# Patient Record
Sex: Female | Born: 1960 | Race: Black or African American | Hispanic: No | Marital: Married | Smoking: Never smoker
Health system: Southern US, Community
[De-identification: ages and names within clinical notes are randomized; demographics above are authoritative.]

## PROBLEM LIST (undated history)

## (undated) DIAGNOSIS — E785 Hyperlipidemia, unspecified: Secondary | ICD-10-CM

## (undated) DIAGNOSIS — B009 Herpesviral infection, unspecified: Secondary | ICD-10-CM

## (undated) DIAGNOSIS — E559 Vitamin D deficiency, unspecified: Secondary | ICD-10-CM

## (undated) DIAGNOSIS — M1990 Unspecified osteoarthritis, unspecified site: Secondary | ICD-10-CM

## (undated) DIAGNOSIS — J302 Other seasonal allergic rhinitis: Secondary | ICD-10-CM

## (undated) DIAGNOSIS — F32A Depression, unspecified: Secondary | ICD-10-CM

## (undated) DIAGNOSIS — Z9989 Dependence on other enabling machines and devices: Secondary | ICD-10-CM

## (undated) DIAGNOSIS — F329 Major depressive disorder, single episode, unspecified: Secondary | ICD-10-CM

## (undated) DIAGNOSIS — I10 Essential (primary) hypertension: Secondary | ICD-10-CM

## (undated) DIAGNOSIS — B379 Candidiasis, unspecified: Secondary | ICD-10-CM

## (undated) DIAGNOSIS — M5030 Other cervical disc degeneration, unspecified cervical region: Secondary | ICD-10-CM

## (undated) DIAGNOSIS — R112 Nausea with vomiting, unspecified: Secondary | ICD-10-CM

## (undated) DIAGNOSIS — R7303 Prediabetes: Secondary | ICD-10-CM

## (undated) DIAGNOSIS — Z9889 Other specified postprocedural states: Secondary | ICD-10-CM

## (undated) DIAGNOSIS — G4733 Obstructive sleep apnea (adult) (pediatric): Secondary | ICD-10-CM

## (undated) HISTORY — PX: CARPAL TUNNEL RELEASE: SHX101

## (undated) HISTORY — PX: TONSILLECTOMY: SUR1361

## (undated) HISTORY — PX: KNEE ARTHROSCOPY WITH ANTERIOR CRUCIATE LIGAMENT (ACL) REPAIR: SHX5644

## (undated) HISTORY — PX: DILATION AND CURETTAGE OF UTERUS: SHX78

## (undated) HISTORY — PX: UPPER GI ENDOSCOPY: SHX6162

## (undated) HISTORY — PX: ABDOMINAL HYSTERECTOMY: SHX81

## (undated) HISTORY — PX: WISDOM TOOTH EXTRACTION: SHX21

## (undated) HISTORY — PX: COLONOSCOPY: SHX174

---

## 2004-01-30 ENCOUNTER — Ambulatory Visit (HOSPITAL_COMMUNITY): Admission: RE | Admit: 2004-01-30 | Discharge: 2004-01-30 | Payer: Self-pay | Admitting: Orthopaedic Surgery

## 2007-10-26 ENCOUNTER — Ambulatory Visit: Payer: Self-pay | Admitting: Cardiology

## 2014-02-05 ENCOUNTER — Emergency Department (HOSPITAL_COMMUNITY)
Admission: EM | Admit: 2014-02-05 | Discharge: 2014-02-06 | Disposition: A | Discharge status: Home / Self Care | Payer: BC Managed Care – PPO | Attending: Emergency Medicine | Admitting: Emergency Medicine

## 2014-02-05 ENCOUNTER — Emergency Department (HOSPITAL_COMMUNITY): Payer: BC Managed Care – PPO

## 2014-02-05 ENCOUNTER — Encounter (HOSPITAL_COMMUNITY): Payer: Self-pay | Admitting: Emergency Medicine

## 2014-02-05 DIAGNOSIS — E876 Hypokalemia: Secondary | ICD-10-CM

## 2014-02-05 DIAGNOSIS — R42 Dizziness and giddiness: Secondary | ICD-10-CM | POA: Insufficient documentation

## 2014-02-05 DIAGNOSIS — I10 Essential (primary) hypertension: Secondary | ICD-10-CM | POA: Insufficient documentation

## 2014-02-05 DIAGNOSIS — R609 Edema, unspecified: Secondary | ICD-10-CM

## 2014-02-05 DIAGNOSIS — R51 Headache: Secondary | ICD-10-CM | POA: Insufficient documentation

## 2014-02-05 LAB — CBC WITH DIFFERENTIAL/PLATELET
BASOS ABS: 0 10*3/uL (ref 0.0–0.1)
Basophils Relative: 0 % (ref 0–1)
EOS ABS: 0.1 10*3/uL (ref 0.0–0.7)
Eosinophils Relative: 1 % (ref 0–5)
HCT: 41.2 % (ref 36.0–46.0)
HEMOGLOBIN: 14 g/dL (ref 12.0–15.0)
LYMPHS ABS: 3.7 10*3/uL (ref 0.7–4.0)
Lymphocytes Relative: 37 % (ref 12–46)
MCH: 30.9 pg (ref 26.0–34.0)
MCHC: 34 g/dL (ref 30.0–36.0)
MCV: 90.9 fL (ref 78.0–100.0)
MONO ABS: 1 10*3/uL (ref 0.1–1.0)
MONOS PCT: 10 % (ref 3–12)
NEUTROS PCT: 52 % (ref 43–77)
Neutro Abs: 5.1 10*3/uL (ref 1.7–7.7)
PLATELETS: 247 10*3/uL (ref 150–400)
RBC: 4.53 MIL/uL (ref 3.87–5.11)
RDW: 12.6 % (ref 11.5–15.5)
WBC: 10 10*3/uL (ref 4.0–10.5)

## 2014-02-05 LAB — COMPREHENSIVE METABOLIC PANEL
ALK PHOS: 61 U/L (ref 39–117)
ALT: 22 U/L (ref 0–35)
AST: 25 U/L (ref 0–37)
Albumin: 3.7 g/dL (ref 3.5–5.2)
BUN: 21 mg/dL (ref 6–23)
CALCIUM: 9.1 mg/dL (ref 8.4–10.5)
CHLORIDE: 97 meq/L (ref 96–112)
CO2: 27 meq/L (ref 19–32)
CREATININE: 0.77 mg/dL (ref 0.50–1.10)
GFR calc Af Amer: >90 mL/min (ref >90)
GFR calc non Af Amer: >90 mL/min (ref >90)
GLUCOSE: 108 mg/dL — AB (ref 70–99)
POTASSIUM: 3.3 meq/L — AB (ref 3.7–5.3)
Sodium: 139 mEq/L (ref 137–147)
TOTAL PROTEIN: 7.5 g/dL (ref 6.0–8.3)
Total Bilirubin: 0.2 mg/dL — ABNORMAL LOW (ref 0.3–1.2)

## 2014-02-05 MED ORDER — POTASSIUM CHLORIDE CRYS ER 20 MEQ PO TBCR: 20.0000 meq | EXTENDED_RELEASE_TABLET | Freq: Every day | ORAL | Status: DC

## 2014-02-05 MED ORDER — POTASSIUM CHLORIDE CRYS ER 20 MEQ PO TBCR
40.0000 meq | EXTENDED_RELEASE_TABLET | Freq: Once | ORAL | Status: AC
  Administered 2014-02-05: 40 meq via ORAL
  Filled 2014-02-05: qty 2

## 2014-02-05 NOTE — Discharge Instructions (Signed)
Edema Edema is an abnormal build-up of fluids in tissues. Because this is partly dependent on gravity (water flows to the lowest place), it is more common in the legs and thighs (lower extremities). It is also common in the looser tissues, like around the eyes. Painless swelling of the feet and ankles is common and increases as a person ages. It may affect both legs and may include the calves or even thighs. When squeezed, the fluid may move out of the affected area and may leave a dent for a few moments. CAUSES   Prolonged standing or sitting in one place for extended periods of time. Movement helps pump tissue fluid into the veins, and absence of movement prevents this, resulting in edema.  Varicose veins. The valves in the veins do not work as well as they should. This causes fluid to leak into the tissues.  Fluid and salt overload.  Injury, burn, or surgery to the leg, ankle, or foot, may damage veins and allow fluid to leak out.  Sunburn damages vessels. Leaky vessels allow fluid to go out into the sunburned tissues.  Allergies (from insect bites or stings, medications or chemicals) cause swelling by allowing vessels to become leaky.  Protein in the blood helps keep fluid in your vessels. Low protein, as in malnutrition, allows fluid to leak out.  Hormonal changes, including pregnancy and menstruation, cause fluid retention. This fluid may leak out of vessels and cause edema.  Medications that cause fluid retention. Examples are sex hormones, blood pressure medications, steroid treatment, or anti-depressants.  Some illnesses cause edema, especially heart failure, kidney disease, or liver disease.  Surgery that cuts veins or lymph nodes, such as surgery done for the heart or for breast cancer, may result in edema. DIAGNOSIS  Your caregiver is usually easily able to determine what is causing your swelling (edema) by simply asking what is wrong (getting a history) and examining you (doing  a physical). Sometimes x-rays, EKG (electrocardiogram or heart tracing), and blood work may be done to evaluate for underlying medical illness. TREATMENT  General treatment includes:  Leg elevation (or elevation of the affected body part).  Restriction of fluid intake.  Prevention of fluid overload.  Compression of the affected body part. Compression with elastic bandages or support stockings squeezes the tissues, preventing fluid from entering and forcing it back into the blood vessels.  Diuretics (also called water pills or fluid pills) pull fluid out of your body in the form of increased urination. These are effective in reducing the swelling, but can have side effects and must be used only under your caregiver's supervision. Diuretics are appropriate only for some types of edema. The specific treatment can be directed at any underlying causes discovered. Heart, liver, or kidney disease should be treated appropriately. HOME CARE INSTRUCTIONS   Elevate the legs (or affected body part) above the level of the heart, while lying down.  Avoid sitting or standing still for prolonged periods of time.  Avoid putting anything directly under the knees when lying down, and do not wear constricting clothing or garters on the upper legs.  Exercising the legs causes the fluid to work back into the veins and lymphatic channels. This may help the swelling go down.  The pressure applied by elastic bandages or support stockings can help reduce ankle swelling.  A low-salt diet may help reduce fluid retention and decrease the ankle swelling.  Take any medications exactly as prescribed. SEEK MEDICAL CARE IF:  Your edema is   not responding to recommended treatments. SEEK IMMEDIATE MEDICAL CARE IF:   You develop shortness of breath or chest pain.  You cannot breathe when you lay down; or if, while lying down, you have to get up and go to the window to get your breath.  You are having increasing  swelling without relief from treatment.  You develop a fever over 102 F (38.9 C).  You develop pain or redness in the areas that are swollen.  Tell your caregiver right away if you have gained 03 lb/1.4 kg in 1 day or 05 lb/2.3 kg in a week. MAKE SURE YOU:   Understand these instructions.  Will watch your condition.  Will get help right away if you are not doing well or get worse. Document Released: 08/12/2005 Document Revised: 02/11/2012 Document Reviewed: 03/30/2008 Exit[employer] Patient Information 2014 [employer].  

## 2014-02-05 NOTE — ED Notes (Signed)
Patient reports blood pressure has been elevated since yesterday. Denies history of hypertension. Also reports lower extremity edema since yesterday. Patient states took 40 mg of lasix twice today.

## 2014-02-05 NOTE — ED Provider Notes (Signed)
CSN: [id number]     Arrival date & time 02/05/14  2105 History  This chart was scribed for Joya Gaskinsonald W Shanya Ferriss, MD by Alexandra Clark, ED Scribe. The patient was seen in room APA06/APA06. Patient's care was started at 9:34 PM.  Chief Complaint  Patient presents with  . Hypertension  . Leg Swelling    Patient is a 53 y.o. female presenting with hypertension. The history is provided by the patient. No language interpreter was used.  Hypertension This is a new problem. The current episode started yesterday. The problem occurs constantly. The problem has not changed since onset.Associated symptoms include headaches. Pertinent negatives include no chest pain, no abdominal pain and no shortness of breath. Nothing relieves the symptoms. Treatments tried: Lasix. The treatment provided no relief.    HPI Comments: Alexandra Clark is a 53 y.o. female who presents to the Emergency Department complaining of constant, bilateral foot and leg swelling and elevated blood pressure for the past 1 day. She says that she has never been diagnosed with hypertension and is not on any antihypertensives, but she has a history of fluid retention problems and takes Lasix. She reports that yesterday evening when she noticed the lower extremity swelling, she took Lasix 40 mg. She also took her blood pressure at that time and noted that it was elevated. She states that today, her symptoms have not improved despite taking 2 more doses of Lasix 40 mg. She states that she has gained 5 pounds in the last few days. She has also had an intermittent headache today, but says that pain is only mild to moderate in severity. She denies fever, vomiting, chest pain, nausea, shortness of breath, abdominal pain, or loss of consciousness.  PMH - peripheral edema Past Surgical History  Procedure Laterality Date  . Abdominal hysterectomy     History reviewed. No pertinent family history. History  Substance Use Topics  . Smoking status: Never  Smoker   . Smokeless tobacco: Not on file  . Alcohol Use: No   OB History   Grav Para Term Preterm Abortions TAB SAB Ect Mult Living                 Review of Systems  Constitutional: Negative for fever.  Respiratory: Negative for shortness of breath.   Cardiovascular: Positive for leg swelling. Negative for chest pain.  Gastrointestinal: Negative for nausea, vomiting and abdominal pain.  Neurological: Positive for dizziness and headaches. Negative for syncope.  All other systems reviewed and are negative.   Allergies  Review of patient's allergies indicates no known allergies.  Home Medications   Prior to Admission medications   Not on File   Triage Vitals: BP 168/85  Pulse 90  Temp(Src) 98.4 F (36.9 C) (Oral)  Resp 22  Ht 5' 5.5" (1.664 m)  Wt 215 lb (97.523 kg)  BMI 35.22 kg/m2  SpO2 97% Physical Exam CONSTITUTIONAL: Well developed/well nourished HEAD: Normocephalic/atraumatic EYES: EOMI/PERRL ENMT: Mucous membranes moist NECK: supple no meningeal signs SPINE:entire spine nontender CV: S1/S2 noted, no murmurs/rubs/gallops noted LUNGS: Lungs are clear to auscultation bilaterally, no apparent distress ABDOMEN: soft, nontender, no rebound or guarding GU:no cva tenderness NEURO: Pt is awake/alert, moves all extremitiesx4 EXTREMITIES: pulses normal, full ROM, symmetric pitting edema of bilateral lower extremities No calf tenderness noted SKIN: warm, color normal PSYCH: no abnormalities of mood noted  ED Course  Procedures  DIAGNOSTIC STUDIES: Oxygen Saturation is 97% on room air, normal by my interpretation.    COORDINATION OF  CARE: 9:38 PM- Patient informed of current plan for treatment and evaluation and agrees with plan at this time. 11:14 PM Pt well appearing, no distress, no signs of CHF.  I doubt bilateral DVT with h/o peripheral edema previously Her BP is improved She requests Rx for potassium at discharge and she wants to continue lasix I advised  need for f/u with PCP  Labs Review Labs Reviewed  COMPREHENSIVE METABOLIC PANEL - Abnormal; Notable for the following:    Potassium 3.3 (*)    Glucose, Bld 108 (*)    Total Bilirubin 0.2 (*)    All other components within normal limits  CBC WITH DIFFERENTIAL    Imaging Review Dg Chest 2 View  02/05/2014   CLINICAL DATA:  Hypertension. Bilateral lower extremity swelling. Foot pain. Pain into the left foot and right arm. Shortness of breath.  EXAM: CHEST  2 VIEW  COMPARISON:  None.  FINDINGS: The heart size is normal. The lungs are clear. The visualized soft tissues and bony thorax are unremarkable.  IMPRESSION: Negative two view chest.   Electronically Signed   By: Gennette Pachris  Mattern M.D.   On: 02/05/2014 22:00     EKG Interpretation   Date/Time:  Saturday February 05 2014 22:04:48 EDT Ventricular Rate:  76 PR Interval:  178 QRS Duration: 95 QT Interval:  384 QTC Calculation: 432 R Axis:   24 Text Interpretation:  Sinus rhythm Low voltage, precordial leads  Borderline T abnormalities, anterior leads No previous ECGs available  Confirmed by Bebe ShaggyWICKLINE  MD, Dorinda HillNALD ([id number]) on 02/05/2014 10:08:05 PM      MDM   Final diagnoses:  Hypokalemia  Peripheral edema    Nursing notes including past medical history and social history reviewed and considered in documentation Labs/vital reviewed and considered xrays reviewed and considered   I personally performed the services described in this documentation, which was scribed in my presence. The recorded information has been reviewed and is accurate.     Joya Gaskinsonald W Marti Mclane, MD 02/05/14 [phone]

## 2014-02-05 NOTE — ED Notes (Signed)
Patient reports intermittent shooting pains through left ankle today as well.

## 2015-04-01 ENCOUNTER — Emergency Department (HOSPITAL_COMMUNITY)
Admission: EM | Admit: 2015-04-01 | Discharge: 2015-04-01 | Disposition: A | Discharge status: Home / Self Care | Payer: BLUE CROSS/BLUE SHIELD | Attending: Emergency Medicine | Admitting: Emergency Medicine

## 2015-04-01 ENCOUNTER — Encounter (HOSPITAL_COMMUNITY): Payer: Self-pay | Admitting: Emergency Medicine

## 2015-04-01 ENCOUNTER — Emergency Department (HOSPITAL_COMMUNITY): Payer: BLUE CROSS/BLUE SHIELD

## 2015-04-01 DIAGNOSIS — M25512 Pain in left shoulder: Secondary | ICD-10-CM | POA: Diagnosis present

## 2015-04-01 DIAGNOSIS — I10 Essential (primary) hypertension: Secondary | ICD-10-CM | POA: Insufficient documentation

## 2015-04-01 DIAGNOSIS — Z79899 Other long term (current) drug therapy: Secondary | ICD-10-CM | POA: Diagnosis not present

## 2015-04-01 DIAGNOSIS — M5412 Radiculopathy, cervical region: Secondary | ICD-10-CM | POA: Diagnosis not present

## 2015-04-01 HISTORY — DX: Essential (primary) hypertension: I10

## 2015-04-01 MED ORDER — DIAZEPAM 2 MG PO TABS
2.0000 mg | ORAL_TABLET | Freq: Once | ORAL | Status: AC
  Administered 2015-04-01: 2 mg via ORAL
  Filled 2015-04-01: qty 1

## 2015-04-01 MED ORDER — DIAZEPAM 2 MG PO TABS: 2.0000 mg | ORAL_TABLET | Freq: Three times a day (TID) | ORAL | Status: DC | PRN

## 2015-04-01 MED ORDER — NAPROXEN 250 MG PO TABS
500.0000 mg | ORAL_TABLET | Freq: Once | ORAL | Status: AC
  Administered 2015-04-01: 500 mg via ORAL
  Filled 2015-04-01: qty 2

## 2015-04-01 NOTE — Discharge Instructions (Signed)
Cervical Radiculopathy °Cervical radiculopathy means a nerve in the neck is pinched or bruised. This can cause pain or loss of feeling (numbness) that runs from your neck to your arm and fingers. °HOME CARE  °· Put ice on the injured or painful area. °¨ Put ice in a plastic bag. °¨ Place a towel between your skin and the bag. °¨ Leave the ice on for 15-20 minutes, 03-04 times a day, or as told by your doctor. °· If ice does not help, you can try using heat. Take a warm shower or bath, or use a hot water bottle as told by your doctor. °· You may try a gentle neck and shoulder massage. °· Use a flat pillow when you sleep. °· Only take medicines as told by your doctor. °· Keep all physical therapy visits as told by your doctor. °· If you are given a soft collar, wear it as told by your doctor. °GET HELP RIGHT AWAY IF:  °· Your pain gets worse and is not controlled with medicine. °· You lose feeling or feel weak in your hand, arm, face, or leg. °· You have a fever or stiff neck. °· You cannot control when you poop or pee (incontinence). °· You have trouble with walking, balance, or speaking. °MAKE SURE YOU:  °· Understand these instructions. °· Will watch your condition. °· Will get help right away if you are not doing well or get worse. °Document Released: 08/01/2011 Document Revised: 11/04/2011 Document Reviewed: 08/01/2011 °[employer]® Patient Information ©2015 [employer]. This information is not intended to replace advice given to you by your health care provider. Make sure you discuss any questions you have with your health care provider. ° °

## 2015-04-01 NOTE — ED Notes (Signed)
Pain to left shoulder.  Rates pain 8/10.  Denies injury.

## 2015-04-04 NOTE — ED Provider Notes (Signed)
CSN: [id number]     Arrival date & time 04/01/15  1635 History   First MD Initiated Contact with Patient 04/01/15 1649     Chief Complaint  Patient presents with  . Shoulder Pain     (Consider location/radiation/quality/duration/timing/severity/associated sxs/prior Treatment) The history is provided by the patient.   Alexandra Clark is a 54 y.o. female with a past medical history of HTN presenting with a several day history of left neck and shoulder pain with intermittent radiation into her left arm.  She describes waking in the morning with numbness in her fingertips which improves after a few minutes of movement  (states has had bilateral hand numbness in the past and was told years ago she may have carpal tunnel syndrome but has had no formal testing for this condition).  She denies neck or shoulder injury and denies chest pain, sob, back pain or weakness in her arms or hands.  Pain is worsened with movement of her neck and with palpation along her left lateral neck and shoulder. She has found no alleviators.     Past Medical History  Diagnosis Date  . Hypertension    Past Surgical History  Procedure Laterality Date  . Abdominal hysterectomy     History reviewed. No pertinent family history. History  Substance Use Topics  . Smoking status: Never Smoker   . Smokeless tobacco: Not on file  . Alcohol Use: No   OB History    No data available     Review of Systems  Constitutional: Negative for fever.  Musculoskeletal: Positive for arthralgias and neck pain. Negative for myalgias and joint swelling.  Neurological: Positive for numbness. Negative for weakness.      Allergies  Review of patient's allergies indicates no known allergies.  Home Medications   Prior to Admission medications   Medication Sig Start Date End Date Taking? Authorizing Provider  atenolol-chlorthalidone (TENORETIC) 50-25 MG per tablet Take 1 tablet by mouth daily. 03/03/15  Yes Historical Provider, MD   diclofenac (VOLTAREN) 75 MG EC tablet Take 75 mg by mouth 2 (two) times daily as needed for mild pain.  02/21/15  Yes Historical Provider, MD  furosemide (LASIX) 40 MG tablet Take 40 mg by mouth 2 (two) times daily as needed for fluid.    Yes Historical Provider, MD  KLOR-CON M10 10 MEQ tablet Take 10 mEq by mouth daily as needed. 03/20/15  Yes Historical Provider, MD  Vitamin D, Ergocalciferol, (DRISDOL) 50000 UNITS CAPS capsule Take 1 capsule by mouth once a week. 01/07/14  Yes Historical Provider, MD  diazepam (VALIUM) 2 MG tablet Take 1 tablet (2 mg total) by mouth every 8 (eight) hours as needed for muscle spasms. 04/01/15   Alexandra Amor, PA-C   BP 132/61 mmHg  Pulse 77  Temp(Src) 98 F (36.7 C) (Oral)  Resp 18  Ht 5\' 5"  (1.651 m)  Wt 213 lb (96.616 kg)  BMI 35.44 kg/m2  SpO2 100% Physical Exam  Constitutional: She appears well-developed and well-nourished.  HENT:  Head: Atraumatic.  Neck: Normal range of motion.  Cardiovascular:  Pulses:      Radial pulses are 2+ on the right side, and 2+ on the left side.  Pulses equal bilaterally  Musculoskeletal: She exhibits tenderness.       Cervical back: She exhibits tenderness. She exhibits no bony tenderness, no swelling, no edema and no deformity.       Back:  Point ttp left trapezius at posteriorlateral neck which triggers radiation  of pain into the shoulder. No midline pain.  Neurological: She is alert. She has normal strength. She displays normal reflexes. No cranial nerve deficit or sensory deficit.  Equal grip strength.  Positive Tinels sign bilateral.  Skin: Skin is warm and dry.  Psychiatric: She has a normal mood and affect.    ED Course  Procedures (including critical care time) Labs Review Labs Reviewed - No data to display  Imaging Review No results found.   EKG Interpretation None      MDM   Final diagnoses:  Cervical radiculopathy    Patients labs and/or radiological studies were reviewed and considered  during the medical decision making and disposition process. Imaging was reviewed, interpreted and I agree with radiologists reading. Results were also discussed with patient.  Pt was placed on valium for muscle spasm, advised to take her diclofenac bid, heat tx.  Pt was referred to Dr. Gerilyn Pilgrim for further evaluation of sx.  Pt does have hx and sx suggsting carpal tunnel, but suspect cervical source of radiculopathy as well.   The patient appears reasonably screened and/or stabilized for discharge and I doubt any other medical condition or other [facility] requiring further screening, evaluation, or treatment in the ED at this time prior to discharge.      Alexandra Amor, PA-C 04/04/15 1459  Benjiman Core, MD 04/05/15 [phone]

## 2015-07-04 ENCOUNTER — Emergency Department (HOSPITAL_COMMUNITY)
Admission: EM | Admit: 2015-07-04 | Discharge: 2015-07-04 | Disposition: A | Discharge status: Home / Self Care | Payer: BLUE CROSS/BLUE SHIELD | Attending: Emergency Medicine | Admitting: Emergency Medicine

## 2015-07-04 ENCOUNTER — Encounter (HOSPITAL_COMMUNITY): Payer: Self-pay | Admitting: Cardiology

## 2015-07-04 ENCOUNTER — Emergency Department (HOSPITAL_COMMUNITY): Payer: BLUE CROSS/BLUE SHIELD

## 2015-07-04 DIAGNOSIS — S4992XA Unspecified injury of left shoulder and upper arm, initial encounter: Secondary | ICD-10-CM | POA: Diagnosis present

## 2015-07-04 DIAGNOSIS — Y9389 Activity, other specified: Secondary | ICD-10-CM | POA: Insufficient documentation

## 2015-07-04 DIAGNOSIS — S46912A Strain of unspecified muscle, fascia and tendon at shoulder and upper arm level, left arm, initial encounter: Secondary | ICD-10-CM | POA: Insufficient documentation

## 2015-07-04 DIAGNOSIS — W1839XA Other fall on same level, initial encounter: Secondary | ICD-10-CM | POA: Insufficient documentation

## 2015-07-04 DIAGNOSIS — Y9289 Other specified places as the place of occurrence of the external cause: Secondary | ICD-10-CM | POA: Insufficient documentation

## 2015-07-04 DIAGNOSIS — Z7982 Long term (current) use of aspirin: Secondary | ICD-10-CM | POA: Insufficient documentation

## 2015-07-04 DIAGNOSIS — Y998 Other external cause status: Secondary | ICD-10-CM | POA: Insufficient documentation

## 2015-07-04 DIAGNOSIS — T148XXA Other injury of unspecified body region, initial encounter: Secondary | ICD-10-CM

## 2015-07-04 DIAGNOSIS — I10 Essential (primary) hypertension: Secondary | ICD-10-CM | POA: Diagnosis not present

## 2015-07-04 DIAGNOSIS — Z79899 Other long term (current) drug therapy: Secondary | ICD-10-CM | POA: Insufficient documentation

## 2015-07-04 MED ORDER — OXYCODONE-ACETAMINOPHEN 5-325 MG PO TABS
1.0000 | ORAL_TABLET | Freq: Once | ORAL | Status: AC
  Administered 2015-07-04: 1 via ORAL
  Filled 2015-07-04: qty 1

## 2015-07-04 MED ORDER — OXYCODONE-ACETAMINOPHEN 5-325 MG PO TABS: ORAL_TABLET | ORAL | Status: DC

## 2015-07-04 MED ORDER — FLURBIPROFEN 100 MG PO TABS: 100.0000 mg | ORAL_TABLET | Freq: Two times a day (BID) | ORAL | Status: DC

## 2015-07-04 NOTE — ED Notes (Signed)
Discharge instructions given to pt and her husband, reviewed prescriptions and need for FU-- pt informed this nurse that she had received call from MD office to inform her cholesterol was elevated - will also be returning for fu with regards to hypertension so will see MD re- shoulder if pain continues. Ambulated off unit with husband- declined offer of work notes

## 2015-07-04 NOTE — ED Notes (Signed)
Left arm pain that started this morning.  Pain worse with movement.  States she did fall on that same side last week.

## 2015-07-05 NOTE — ED Provider Notes (Signed)
CSN: [id number]     Arrival date & time 07/04/15  1003 History   First MD Initiated Contact with Patient 07/04/15 1027     Chief Complaint  Patient presents with  . Arm Injury     (Consider location/radiation/quality/duration/timing/severity/associated sxs/prior Treatment) HPI   Alexandra Clark is a 54 y.o. female who presents to the Emergency Department complaining of worsening of left shoulder pain.  She reports hx of pain and tingling of the left arm approximately three months ago and was thought to have cervical radiculopathy.  She states that she was seen by a neurologist and treated with injections to the muscles.  Pain was improving.  She reports a fall last week onto an outstretched hand, pain improved after few days, but woke this morning with sharp pain to the back of her shoulder and radiating into her arm.  Pain was not relieved by her pain medications.  She denies swelling, redness, excessive warmth, neck pain, headaches, chest pain, diaphoresis.  Pain worse with left arm movement.     Past Medical History  Diagnosis Date  . Hypertension    Past Surgical History  Procedure Laterality Date  . Abdominal hysterectomy     History reviewed. No pertinent family history. Social History  Substance Use Topics  . Smoking status: Never Smoker   . Smokeless tobacco: None  . Alcohol Use: No   OB History    No data available     Review of Systems  Constitutional: Negative for fever and chills.  Musculoskeletal: Positive for myalgias and arthralgias. Negative for joint swelling.  Skin: Negative for color change and wound.  Neurological: Negative for dizziness, weakness, light-headedness, numbness and headaches.  All other systems reviewed and are negative.     Allergies  Review of patient's allergies indicates no known allergies.  Home Medications   Prior to Admission medications   Medication Sig Start Date End Date Taking? Authorizing Provider  aspirin 81 MG tablet  Take 81 mg by mouth daily.   Yes Historical Provider, MD  atenolol-chlorthalidone (TENORETIC) 50-25 MG per tablet Take 1 tablet by mouth daily. 03/03/15  Yes Historical Provider, MD  furosemide (LASIX) 40 MG tablet Take 40 mg by mouth 2 (two) times daily as needed for fluid.    Yes Historical Provider, MD  gabapentin (NEURONTIN) 300 MG capsule Take 1 capsule by mouth 2 (two) times daily. 06/25/15  Yes Historical Provider, MD  KLOR-CON M10 10 MEQ tablet Take 10 mEq by mouth daily as needed. 03/20/15  Yes Historical Provider, MD  metaxalone (SKELAXIN) 800 MG tablet Take 1 tablet by mouth 3 (three) times daily as needed. 04/10/15  Yes Historical Provider, MD  ranitidine (ZANTAC) 75 MG tablet Take 75 mg by mouth at bedtime.   Yes Historical Provider, MD  Vitamin D, Ergocalciferol, (DRISDOL) 50000 UNITS CAPS capsule Take 1 capsule by mouth once a week. 01/07/14  Yes Historical Provider, MD  diazepam (VALIUM) 2 MG tablet Take 1 tablet (2 mg total) by mouth every 8 (eight) hours as needed for muscle spasms. Patient not taking: Reported on 07/04/2015 04/01/15   Burgess Amor, PA-C  diclofenac (VOLTAREN) 75 MG EC tablet Take 75 mg by mouth 2 (two) times daily as needed for mild pain.  02/21/15   Historical Provider, MD  flurbiprofen (ANSAID) 100 MG tablet Take 1 tablet (100 mg total) by mouth 2 (two) times daily. Take with food 07/04/15   Tykisha Areola, PA-C  oxyCODONE-acetaminophen (PERCOCET/ROXICET) 5-325 MG tablet One tablet every  4-6 hrs as needed for pain 07/04/15   Ameriah Lint, PA-C   BP 114/70 mmHg  Pulse 62  Temp(Src) 97.8 F (36.6 C) (Oral)  Resp 16  Ht 5\' 5"  (1.651 m)  Wt 223 lb (101.152 kg)  BMI 37.11 kg/m2  SpO2 97% Physical Exam  Constitutional: She is oriented to person, place, and time. She appears well-developed and well-nourished. No distress.  HENT:  Head: Normocephalic and atraumatic.  Neck: Normal range of motion. Neck supple. No thyromegaly present.  Cardiovascular: Normal rate, regular  rhythm, normal heart sounds and intact distal pulses.   No murmur heard. Pulmonary/Chest: Effort normal and breath sounds normal. No respiratory distress. She exhibits no tenderness.  Musculoskeletal: She exhibits tenderness. She exhibits no edema.  Tenderness to palp of the musculature of left scapular border.  Radial pulse is brisk, distal sensation intact, CR< 2 sec. Grip strength is strong and symmetrical.   No abrasions, edema , erythema, excessive warmth or step-off deformity of the joint.   Lymphadenopathy:    She has no cervical adenopathy.  Neurological: She is alert and oriented to person, place, and time. She has normal strength. No sensory deficit. She exhibits normal muscle tone. Coordination normal.  Skin: Skin is warm and dry.  Nursing note and vitals reviewed.   ED Course  Procedures (including critical care time) Labs Review Labs Reviewed - No data to display  Imaging Review Dg Shoulder Left  07/04/2015  CLINICAL DATA:  54 year old female who fell 7 days ago with left shoulder pain. Decreased range of motion. Initial encounter. EXAM: LEFT SHOULDER - 2+ VIEW COMPARISON:  None. FINDINGS: Bone mineralization is within normal limits for age. No glenohumeral joint dislocation. Proximal left humerus intact. Left clavicle and scapula appear intact. Visualized left ribs appear intact. IMPRESSION: No acute osseous abnormality identified about the left shoulder. Electronically Signed   By: Odessa FlemingH  Hall M.D.   On: 07/04/2015 11:32   I have personally reviewed and evaluated these images and lab results as part of my medical decision-making.   EKG Interpretation None      MDM   Final diagnoses:  Muscle strain   Pt is well appearing.  NV, NS intact.  Shoulder film neg for fx.  Tenderness of muscles of the left scapular border.  No spinal tenderness, she has seen neurology in the past for same.  Low clinical suspicion for cardiac process.  Sx's aggravated secondary to fall.  Appears  stable for d/c and agrees to arrange PMD f/u.     Pauline Ausammy Aceton Kinnear, PA-C 07/05/15 2149  Lavera Guiseana Duo Liu, MD 07/05/15 2157

## 2016-07-17 ENCOUNTER — Other Ambulatory Visit: Payer: Self-pay | Admitting: Internal Medicine

## 2016-07-17 DIAGNOSIS — R928 Other abnormal and inconclusive findings on diagnostic imaging of breast: Secondary | ICD-10-CM

## 2016-07-30 ENCOUNTER — Ambulatory Visit
Admission: RE | Admit: 2016-07-30 | Discharge: 2016-07-30 | Disposition: A | Discharge status: Home / Self Care | Payer: BLUE CROSS/BLUE SHIELD | Source: Ambulatory Visit | Attending: Internal Medicine | Admitting: Internal Medicine

## 2016-07-30 DIAGNOSIS — R928 Other abnormal and inconclusive findings on diagnostic imaging of breast: Secondary | ICD-10-CM

## 2017-04-01 ENCOUNTER — Encounter (HOSPITAL_COMMUNITY): Payer: Self-pay | Admitting: *Deleted

## 2017-04-01 ENCOUNTER — Emergency Department (HOSPITAL_COMMUNITY): Payer: BLUE CROSS/BLUE SHIELD

## 2017-04-01 ENCOUNTER — Observation Stay (HOSPITAL_COMMUNITY)
Admission: EM | Admit: 2017-04-01 | Discharge: 2017-04-03 | Disposition: A | Discharge status: Home / Self Care | Payer: BLUE CROSS/BLUE SHIELD | Attending: Cardiology | Admitting: Cardiology

## 2017-04-01 DIAGNOSIS — G5600 Carpal tunnel syndrome, unspecified upper limb: Secondary | ICD-10-CM | POA: Diagnosis not present

## 2017-04-01 DIAGNOSIS — Z79899 Other long term (current) drug therapy: Secondary | ICD-10-CM | POA: Insufficient documentation

## 2017-04-01 DIAGNOSIS — E559 Vitamin D deficiency, unspecified: Secondary | ICD-10-CM | POA: Diagnosis present

## 2017-04-01 DIAGNOSIS — R7303 Prediabetes: Secondary | ICD-10-CM | POA: Insufficient documentation

## 2017-04-01 DIAGNOSIS — Z791 Long term (current) use of non-steroidal anti-inflammatories (NSAID): Secondary | ICD-10-CM | POA: Diagnosis not present

## 2017-04-01 DIAGNOSIS — Z9989 Dependence on other enabling machines and devices: Secondary | ICD-10-CM | POA: Insufficient documentation

## 2017-04-01 DIAGNOSIS — I10 Essential (primary) hypertension: Secondary | ICD-10-CM | POA: Diagnosis not present

## 2017-04-01 DIAGNOSIS — R946 Abnormal results of thyroid function studies: Secondary | ICD-10-CM | POA: Diagnosis not present

## 2017-04-01 DIAGNOSIS — E7800 Pure hypercholesterolemia, unspecified: Secondary | ICD-10-CM | POA: Diagnosis not present

## 2017-04-01 DIAGNOSIS — G4733 Obstructive sleep apnea (adult) (pediatric): Secondary | ICD-10-CM | POA: Diagnosis not present

## 2017-04-01 DIAGNOSIS — M47892 Other spondylosis, cervical region: Secondary | ICD-10-CM | POA: Insufficient documentation

## 2017-04-01 DIAGNOSIS — R0789 Other chest pain: Secondary | ICD-10-CM | POA: Diagnosis not present

## 2017-04-01 DIAGNOSIS — Z7982 Long term (current) use of aspirin: Secondary | ICD-10-CM | POA: Insufficient documentation

## 2017-04-01 DIAGNOSIS — E876 Hypokalemia: Secondary | ICD-10-CM | POA: Diagnosis not present

## 2017-04-01 DIAGNOSIS — I214 Non-ST elevation (NSTEMI) myocardial infarction: Secondary | ICD-10-CM | POA: Diagnosis present

## 2017-04-01 HISTORY — DX: Vitamin D deficiency, unspecified: E55.9

## 2017-04-01 HISTORY — DX: Obstructive sleep apnea (adult) (pediatric): G47.33

## 2017-04-01 HISTORY — DX: Dependence on other enabling machines and devices: Z99.89

## 2017-04-01 HISTORY — DX: Hyperlipidemia, unspecified: E78.5

## 2017-04-01 HISTORY — DX: Other cervical disc degeneration, unspecified cervical region: M50.30

## 2017-04-01 HISTORY — DX: Prediabetes: R73.03

## 2017-04-01 LAB — COMPREHENSIVE METABOLIC PANEL
ALT: 22 U/L (ref 14–54)
ANION GAP: 10 (ref 5–15)
AST: 27 U/L (ref 15–41)
Albumin: 4.2 g/dL (ref 3.5–5.0)
Alkaline Phosphatase: 56 U/L (ref 38–126)
BUN: 18 mg/dL (ref 6–20)
CHLORIDE: 98 mmol/L — AB (ref 101–111)
CO2: 27 mmol/L (ref 22–32)
CREATININE: 1 mg/dL (ref 0.44–1.00)
Calcium: 9.6 mg/dL (ref 8.9–10.3)
Glucose, Bld: 148 mg/dL — ABNORMAL HIGH (ref 65–99)
Potassium: 3 mmol/L — ABNORMAL LOW (ref 3.5–5.1)
Sodium: 135 mmol/L (ref 135–145)
Total Bilirubin: 0.7 mg/dL (ref 0.3–1.2)
Total Protein: 8.5 g/dL — ABNORMAL HIGH (ref 6.5–8.1)

## 2017-04-01 LAB — CBC WITH DIFFERENTIAL/PLATELET
Basophils Absolute: 0 10*3/uL (ref 0.0–0.1)
Basophils Relative: 0 %
EOS PCT: 0 %
Eosinophils Absolute: 0 10*3/uL (ref 0.0–0.7)
HCT: 42.9 % (ref 36.0–46.0)
Hemoglobin: 14.3 g/dL (ref 12.0–15.0)
LYMPHS ABS: 2.5 10*3/uL (ref 0.7–4.0)
LYMPHS PCT: 24 %
MCH: 30.6 pg (ref 26.0–34.0)
MCHC: 33.3 g/dL (ref 30.0–36.0)
MCV: 91.9 fL (ref 78.0–100.0)
MONO ABS: 0.3 10*3/uL (ref 0.1–1.0)
MONOS PCT: 3 %
Neutro Abs: 7.2 10*3/uL (ref 1.7–7.7)
Neutrophils Relative %: 73 %
PLATELETS: 247 10*3/uL (ref 150–400)
RBC: 4.67 MIL/uL (ref 3.87–5.11)
RDW: 13.2 % (ref 11.5–15.5)
WBC: 10.1 10*3/uL (ref 4.0–10.5)

## 2017-04-01 MED ORDER — NITROGLYCERIN 2 % TD OINT
1.0000 [in_us] | TOPICAL_OINTMENT | Freq: Once | TRANSDERMAL | Status: AC
  Administered 2017-04-01: 1 [in_us] via TOPICAL
  Filled 2017-04-01: qty 1

## 2017-04-01 MED ORDER — HEPARIN BOLUS VIA INFUSION
4000.0000 [IU] | Freq: Once | INTRAVENOUS | Status: AC
  Administered 2017-04-01: 4000 [IU] via INTRAVENOUS

## 2017-04-01 MED ORDER — ASPIRIN 81 MG PO CHEW
324.0000 mg | CHEWABLE_TABLET | Freq: Once | ORAL | Status: AC
  Administered 2017-04-01: 324 mg via ORAL
  Filled 2017-04-01: qty 4

## 2017-04-01 MED ORDER — HEPARIN (PORCINE) IN NACL 100-0.45 UNIT/ML-% IJ SOLN
12.0000 [IU]/kg/h | INTRAMUSCULAR | Status: DC
  Administered 2017-04-01: 12 [IU]/kg/h via INTRAVENOUS
  Filled 2017-04-01: qty 250

## 2017-04-01 MED ORDER — METOPROLOL TARTRATE 5 MG/5ML IV SOLN
2.5000 mg | Freq: Once | INTRAVENOUS | Status: AC
  Administered 2017-04-01: 2.5 mg via INTRAVENOUS
  Filled 2017-04-01: qty 5

## 2017-04-01 MED ORDER — ATORVASTATIN CALCIUM 80 MG PO TABS
80.0000 mg | ORAL_TABLET | Freq: Every day | ORAL | Status: DC
  Filled 2017-04-01: qty 1

## 2017-04-01 NOTE — ED Triage Notes (Signed)
C/o chest pressure with shortness of breath

## 2017-04-01 NOTE — ED Notes (Signed)
Date and time results received: 04/01/17 2215   Test: troponin Critical Value: 10.83  Name of Provider Notified: Hyacinth MeekerMiller  Orders Received? Or Actions Taken?: N/A

## 2017-04-01 NOTE — ED Provider Notes (Signed)
[facility]-EMER[facility]ENCY DEPT Provider Note   CSN: [id number] Arrival date & time: 04/01/17  2046     History   Chief Complaint Chief Complaint  Patient presents with  . Chest Pain    HPI AVALON COPPINGER is a 56 y.o. female.  HPI  The patient is a 56 year old female, she has been told in the past that she had hypertension and a thickened heart muscle based on an ultrasound by her doctor. She presents to the hospital today with a complaint of chest pain and shortness of breath which started several hours prior to arrival. It started with shortness of breath, nothing seemed to make this better or worse but it would come and go, shortness of breath got worse at home prompting her visit to the emergency department. In the car on the way here she started to develop chest discomfort especially on the left side of her chest which radiated towards the sternum, was heavy and has now spontaneously resolved. She reports that she is not very active but when she does exert herself she does become dyspneic quite easily which she states is not out of the usual for the last year. She has chronic lymphedema and swelling of the lower extremities for which she wears compression socks and takes Lasix. The patient denies any diaphoresis, coughing, fever, abdominal pain, back pain, headache, blurred vision. She reports having a stress test many years ago. She does not smoke cigarettes and has no other risk factors for pulmonary embolism.  Past Medical History:  Diagnosis Date  . DDD (degenerative disc disease), cervical   . Hypertension     There are no active problems to display for this patient.   Past Surgical History:  Procedure Laterality Date  . ABDOMINAL HYSTERECTOMY      OB History    No data available       Home Medications    Prior to Admission medications   Medication Sig Start Date End Date Taking? Authorizing Provider  aspirin 81 MG tablet Take 81 mg by mouth daily.    [provider]  atenolol-chlorthalidone (TENORETIC) 50-25 MG per tablet Take 1 tablet by mouth daily. 03/03/15   [provider]  diazepam (VALIUM) 2 MG tablet Take 1 tablet (2 mg total) by mouth every 8 (eight) hours as needed for muscle spasms. Patient not taking: Reported on 07/04/2015 04/01/15   Burgess Amor, PA-C  diclofenac (VOLTAREN) 75 MG EC tablet Take 75 mg by mouth 2 (two) times daily as needed for mild pain.  02/21/15   [provider]  flurbiprofen (ANSAID) 100 MG tablet Take 1 tablet (100 mg total) by mouth 2 (two) times daily. Take with food 07/04/15   Triplett, Tammy, PA-C  furosemide (LASIX) 40 MG tablet Take 40 mg by mouth 2 (two) times daily as needed for fluid.     [provider]  gabapentin (NEURONTIN) 300 MG capsule Take 1 capsule by mouth 2 (two) times daily. 06/25/15   [provider]  KLOR-CON M10 10 MEQ tablet Take 10 mEq by mouth daily as needed. 03/20/15   [provider]  metaxalone (SKELAXIN) 800 MG tablet Take 1 tablet by mouth 3 (three) times daily as needed. 04/10/15   [provider]  oxyCODONE-acetaminophen (PERCOCET/ROXICET) 5-325 MG tablet One tablet every 4-6 hrs as needed for pain 07/04/15   Triplett, Tammy, PA-C  ranitidine (ZANTAC) 75 MG tablet Take 75 mg by mouth at bedtime.    [provider]  Vitamin  D, Ergocalciferol, (DRISDOL) 50000 UNITS CAPS capsule Take 1 capsule by mouth once a week. 01/07/14   [provider]    Family History No family history on file.  Social History Social History  Substance Use Topics  . Smoking status: Never Smoker  . Smokeless tobacco: Never Used  . Alcohol use No     Allergies   Patient has no known allergies.   Review of Systems Review of Systems  All other systems reviewed and are negative.    Physical Exam Updated Vital Signs BP (!) 143/74   Pulse (!) 109   Resp (!) 22   Ht 5' 5.5" (1.664 m)   Wt 101.2 kg (223 lb)   SpO2 98%   BMI 36.54 kg/m    Physical Exam  Constitutional: She appears well-developed and well-nourished. No distress.  HENT:  Head: Normocephalic and atraumatic.  Mouth/Throat: Oropharynx is clear and moist. No oropharyngeal exudate.  Eyes: Pupils are equal, round, and reactive to light. Conjunctivae and EOM are normal. Right eye exhibits no discharge. Left eye exhibits no discharge. No scleral icterus.  Neck: Normal range of motion. Neck supple. No JVD present. No thyromegaly present.  Cardiovascular: Normal rate, regular rhythm, normal heart sounds and intact distal pulses.  Exam reveals no gallop and no friction rub.   No murmur heard. Pulmonary/Chest: Effort normal and breath sounds normal. No respiratory distress. She has no wheezes. She has no rales.  Abdominal: Soft. Bowel sounds are normal. She exhibits no distension and no mass. There is no tenderness.  Musculoskeletal: Normal range of motion. She exhibits no edema or tenderness.  Lymphadenopathy:    She has no cervical adenopathy.  Neurological: She is alert. Coordination normal.  Skin: Skin is warm and dry. No rash noted. No erythema.  Psychiatric: She has a normal mood and affect. Her behavior is normal.  Nursing note and vitals reviewed.    ED Treatments / Results  Labs (all labs ordered are listed, but only abnormal results are displayed) Labs Reviewed  CBC WITH DIFFERENTIAL/PLATELET  COMPREHENSIVE METABOLIC PANEL  TROPONIN I    ED ECG REPORT  I personally interpreted this EKG   Date: 04/01/2017   Rate: 112  Rhythm: sinus tachycardia  QRS Axis: normal  Intervals: normal  ST/T Wave abnormalities: ST depression in the inferior leads - abnormal T waves diffusely - mild ST elevation in avR  Conduction Disutrbances:none  Narrative Interpretation:   Old EKG Reviewed: changes noted - prior ECG from 02/05/2014 had no ST or T wave abnormalities  Radiology No results found.  Procedures Procedures (including critical care time)  Medications  Ordered in ED Medications  aspirin chewable tablet 324 mg (not administered)     Initial Impression / Assessment and Plan / ED Course  I have reviewed the triage vital signs and the nursing notes.  Pertinent labs & imaging results that were available during my care of the patient were reviewed by me and considered in my medical decision making (see chart for details).     On exam the patient does not appear to be in distress however her husband states that she is significantly stressed out because of home in life and work-related issues. That being said stress does not account for the abnormal EKG findings thus a acute coronary syndrome workup will be initiated given ST depressions and elevation in aVR. The patient is symptom-free at this time, she will be given aspirin, I will repeat EKG shortly and I anticipate admission to  the hospital.  The patient is now symptom-free, unfortunately her troponin is over 10, other electrolytes seem fine except for a slightly low potassium. Kidney function is normal, x-rays unremarkable, the patient is critically ill with a non-ST elevation MI.  Heparin ordered, heparin drip ordered, cardiology consultation emergently.  ED ECG REPORT  I personally interpreted this EKG   Date: 04/01/2017 Rate: 95   23:29   Rhythm: normal sinus rhythm  QRS Axis: normal  Intervals: normal  ST/T Wave abnormalities: nonspecific T wave changes  Conduction Disutrbances:none  Narrative Interpretation:   Old EKG Reviewed: changes noted - ST segments have normalized  I discussed the care with [facility]anda Magicraci [facility] who [facility] the patient pending bed availability however there is no ICU beds at this time. - pt is feeling well still - atorvastatin / metoprolol given at Dr. Norris Crossurner's guidance.   I [facility] Dr. Lyndel SafeUpadhya who has accepted the pt in transfer - they have no bed available at time of transfer to ED  at [facility]  At change of shift - I have been informed by nursing / secretary that transport is being arranged to transport this patient to the ED at [facility] in the best interest of this patient to be cared for at a larger center with cath lab capability as this hospital ([facility])  has no such services - I have called the charge nurse Irving BurtonEmily as well as the physician in the ED - Dr. Judd Lienelo to let them know about transfer.  Repeat ECG is improved   CRITICAL CARE Performed by: Vida RollerBrian D Edris Schneck Total critical care time: 35 minutes Critical care time was exclusive of separately billable procedures and treating other patients. Critical care was necessary to treat or prevent imminent or life-threatening deterioration. Critical care was time spent personally by me on the following activities: development of treatment plan with patient and/or surrogate as well as nursing, discussions with consultants, evaluation of patient's response to treatment, examination of patient, obtaining history from patient or surrogate, ordering and performing treatments and interventions, ordering and review of laboratory studies, ordering and review of radiographic studies, pulse oximetry and re-evaluation of patient's condition.   Final Clinical Impressions(s) / ED Diagnoses   Final diagnoses:  NSTEMI (non-ST elevated myocardial infarction) [facility](HCC)    New Prescriptions New Prescriptions   No medications on file     Eber HongMiller, Xiadani Damman, MD 04/02/17 0117

## 2017-04-02 ENCOUNTER — Encounter (HOSPITAL_COMMUNITY)
Admission: EM | Disposition: A | Discharge status: Home / Self Care | Payer: Self-pay | Source: Home / Self Care | Attending: Cardiology

## 2017-04-02 ENCOUNTER — Encounter (HOSPITAL_COMMUNITY): Payer: Self-pay | Admitting: Cardiology

## 2017-04-02 DIAGNOSIS — G5600 Carpal tunnel syndrome, unspecified upper limb: Secondary | ICD-10-CM | POA: Diagnosis not present

## 2017-04-02 DIAGNOSIS — E559 Vitamin D deficiency, unspecified: Secondary | ICD-10-CM | POA: Diagnosis not present

## 2017-04-02 DIAGNOSIS — I10 Essential (primary) hypertension: Secondary | ICD-10-CM

## 2017-04-02 DIAGNOSIS — R0789 Other chest pain: Secondary | ICD-10-CM

## 2017-04-02 DIAGNOSIS — M47892 Other spondylosis, cervical region: Secondary | ICD-10-CM | POA: Diagnosis not present

## 2017-04-02 DIAGNOSIS — Z9989 Dependence on other enabling machines and devices: Secondary | ICD-10-CM | POA: Diagnosis not present

## 2017-04-02 DIAGNOSIS — Z79899 Other long term (current) drug therapy: Secondary | ICD-10-CM | POA: Diagnosis not present

## 2017-04-02 DIAGNOSIS — R946 Abnormal results of thyroid function studies: Secondary | ICD-10-CM | POA: Diagnosis not present

## 2017-04-02 DIAGNOSIS — I214 Non-ST elevation (NSTEMI) myocardial infarction: Secondary | ICD-10-CM | POA: Diagnosis not present

## 2017-04-02 DIAGNOSIS — Z7982 Long term (current) use of aspirin: Secondary | ICD-10-CM | POA: Diagnosis not present

## 2017-04-02 DIAGNOSIS — Z791 Long term (current) use of non-steroidal anti-inflammatories (NSAID): Secondary | ICD-10-CM | POA: Diagnosis not present

## 2017-04-02 DIAGNOSIS — E7800 Pure hypercholesterolemia, unspecified: Secondary | ICD-10-CM | POA: Diagnosis not present

## 2017-04-02 DIAGNOSIS — G4733 Obstructive sleep apnea (adult) (pediatric): Secondary | ICD-10-CM | POA: Diagnosis not present

## 2017-04-02 DIAGNOSIS — R7303 Prediabetes: Secondary | ICD-10-CM | POA: Diagnosis not present

## 2017-04-02 DIAGNOSIS — E876 Hypokalemia: Secondary | ICD-10-CM | POA: Diagnosis not present

## 2017-04-02 HISTORY — PX: LEFT HEART CATH AND CORONARY ANGIOGRAPHY: CATH118249

## 2017-04-02 LAB — HIV ANTIBODY (ROUTINE TESTING W REFLEX): HIV SCREEN 4TH GENERATION: NONREACTIVE

## 2017-04-02 LAB — COMPREHENSIVE METABOLIC PANEL
ALT: 19 U/L (ref 14–54)
ANION GAP: 8 (ref 5–15)
AST: 20 U/L (ref 15–41)
Albumin: 3.6 g/dL (ref 3.5–5.0)
Alkaline Phosphatase: 45 U/L (ref 38–126)
BUN: 13 mg/dL (ref 6–20)
CHLORIDE: 103 mmol/L (ref 101–111)
CO2: 27 mmol/L (ref 22–32)
Calcium: 9.3 mg/dL (ref 8.9–10.3)
Creatinine, Ser: 0.71 mg/dL (ref 0.44–1.00)
GFR calc Af Amer: >60 mL/min (ref >60)
Glucose, Bld: 128 mg/dL — ABNORMAL HIGH (ref 65–99)
POTASSIUM: 3.3 mmol/L — AB (ref 3.5–5.1)
SODIUM: 138 mmol/L (ref 135–145)
Total Bilirubin: 0.9 mg/dL (ref 0.3–1.2)
Total Protein: 7.1 g/dL (ref 6.5–8.1)

## 2017-04-02 LAB — LIPID PANEL
Cholesterol: 233 mg/dL — ABNORMAL HIGH (ref 0–200)
HDL: 54 mg/dL (ref >40)
LDL CALC: 173 mg/dL — AB (ref 0–99)
TRIGLYCERIDES: 32 mg/dL (ref <150)
Total CHOL/HDL Ratio: 4.3 RATIO
VLDL: 6 mg/dL (ref 0–40)

## 2017-04-02 LAB — PROTIME-INR
INR: 1.1
Prothrombin Time: 14.3 s (ref 11.4–15.2)

## 2017-04-02 LAB — CBC
HCT: 40.7 % (ref 36.0–46.0)
Hemoglobin: 13.3 g/dL (ref 12.0–15.0)
MCH: 29.3 pg (ref 26.0–34.0)
MCHC: 32.7 g/dL (ref 30.0–36.0)
MCV: 89.6 fL (ref 78.0–100.0)
Platelets: 264 10*3/uL (ref 150–400)
RBC: 4.54 MIL/uL (ref 3.87–5.11)
RDW: 13 % (ref 11.5–15.5)
WBC: 10.3 10*3/uL (ref 4.0–10.5)

## 2017-04-02 LAB — GLUCOSE, CAPILLARY
GLUCOSE-CAPILLARY: 124 mg/dL — AB (ref 65–99)
Glucose-Capillary: 104 mg/dL — ABNORMAL HIGH (ref 65–99)
Glucose-Capillary: 94 mg/dL (ref 65–99)

## 2017-04-02 LAB — HEPARIN LEVEL (UNFRACTIONATED): Heparin Unfractionated: 0.48 IU/mL (ref 0.30–0.70)

## 2017-04-02 LAB — MAGNESIUM: MAGNESIUM: 2.1 mg/dL (ref 1.7–2.4)

## 2017-04-02 LAB — TROPONIN I
Troponin I: <0.03 ng/mL
Troponin I: 0.08 ng/mL

## 2017-04-02 LAB — TSH: TSH: 0.333 u[IU]/mL — AB (ref 0.350–4.500)

## 2017-04-02 LAB — MRSA PCR SCREENING: MRSA BY PCR: NEGATIVE

## 2017-04-02 LAB — HEMOGLOBIN A1C
Hgb A1c MFr Bld: 6.1 % — ABNORMAL HIGH (ref 4.8–5.6)
Mean Plasma Glucose: 128.37 mg/dL

## 2017-04-02 SURGERY — LEFT HEART CATH AND CORONARY ANGIOGRAPHY
Anesthesia: LOCAL

## 2017-04-02 MED ORDER — METOPROLOL TARTRATE 12.5 MG HALF TABLET: 12.5000 mg | ORAL_TABLET | Freq: Two times a day (BID) | ORAL | Status: DC

## 2017-04-02 MED ORDER — SODIUM CHLORIDE 0.9 % WEIGHT BASED INFUSION: 1.0000 mL/kg/h | INTRAVENOUS | Status: DC

## 2017-04-02 MED ORDER — ATORVASTATIN CALCIUM 80 MG PO TABS: 80.0000 mg | ORAL_TABLET | Freq: Every day | ORAL | Status: DC

## 2017-04-02 MED ORDER — NITROGLYCERIN 1 MG/10 ML FOR IR/CATH LAB: INTRA_ARTERIAL | Status: DC | PRN

## 2017-04-02 MED ORDER — POTASSIUM CHLORIDE CRYS ER 20 MEQ PO TBCR
40.0000 meq | EXTENDED_RELEASE_TABLET | Freq: Once | ORAL | Status: AC
  Administered 2017-04-02: 40 meq via ORAL
  Filled 2017-04-02: qty 2

## 2017-04-02 MED ORDER — SODIUM CHLORIDE 0.9 % IV SOLN: INTRAVENOUS | Status: AC

## 2017-04-02 MED ORDER — SODIUM CHLORIDE 0.9 % IV SOLN: 250.0000 mL | INTRAVENOUS | Status: DC | PRN

## 2017-04-02 MED ORDER — ASPIRIN EC 81 MG PO TBEC
81.0000 mg | DELAYED_RELEASE_TABLET | Freq: Every day | ORAL | Status: DC
  Administered 2017-04-03: 11:00:00 81 mg via ORAL
  Filled 2017-04-02: qty 1

## 2017-04-02 MED ORDER — IOPAMIDOL (ISOVUE-370) INJECTION 76%
INTRAVENOUS | Status: DC | PRN
  Administered 2017-04-02: 50 mL via INTRAVENOUS

## 2017-04-02 MED ORDER — METOPROLOL TARTRATE 25 MG PO TABS
25.0000 mg | ORAL_TABLET | Freq: Two times a day (BID) | ORAL | Status: DC
  Administered 2017-04-02 (×2): 25 mg via ORAL
  Filled 2017-04-02 (×2): qty 1

## 2017-04-02 MED ORDER — ASPIRIN 81 MG PO CHEW
81.0000 mg | CHEWABLE_TABLET | ORAL | Status: AC
  Administered 2017-04-02: 81 mg via ORAL
  Filled 2017-04-02: qty 1

## 2017-04-02 MED ORDER — LIDOCAINE HCL 1 % IJ SOLN
INTRAMUSCULAR | Status: AC
  Filled 2017-04-02: qty 20

## 2017-04-02 MED ORDER — SODIUM CHLORIDE 0.9% FLUSH: 3.0000 mL | INTRAVENOUS | Status: DC | PRN

## 2017-04-02 MED ORDER — IOPAMIDOL (ISOVUE-370) INJECTION 76%
INTRAVENOUS | Status: AC
  Filled 2017-04-02: qty 100

## 2017-04-02 MED ORDER — SODIUM CHLORIDE 0.9 % WEIGHT BASED INFUSION
3.0000 mL/kg/h | INTRAVENOUS | Status: DC
  Administered 2017-04-02: 3 mL/kg/h via INTRAVENOUS

## 2017-04-02 MED ORDER — VERAPAMIL HCL 2.5 MG/ML IV SOLN
INTRAVENOUS | Status: DC | PRN
  Administered 2017-04-02: 12:00:00 via INTRA_ARTERIAL

## 2017-04-02 MED ORDER — ASPIRIN 81 MG PO CHEW: 324.0000 mg | CHEWABLE_TABLET | ORAL | Status: DC

## 2017-04-02 MED ORDER — HEPARIN SODIUM (PORCINE) 1000 UNIT/ML IJ SOLN
INTRAMUSCULAR | Status: DC | PRN
Start: 2017-04-02 — End: 2017-04-02
  Administered 2017-04-02: 5000 [IU] via INTRAVENOUS

## 2017-04-02 MED ORDER — MIDAZOLAM HCL 2 MG/2ML IJ SOLN
INTRAMUSCULAR | Status: AC
  Filled 2017-04-02: qty 2

## 2017-04-02 MED ORDER — MIDAZOLAM HCL 2 MG/2ML IJ SOLN
INTRAMUSCULAR | Status: DC | PRN
  Administered 2017-04-02: 1 mg via INTRAVENOUS

## 2017-04-02 MED ORDER — ONDANSETRON HCL 4 MG/2ML IJ SOLN: 4.0000 mg | Freq: Four times a day (QID) | INTRAMUSCULAR | Status: DC | PRN

## 2017-04-02 MED ORDER — FENTANYL CITRATE (PF) 100 MCG/2ML IJ SOLN
INTRAMUSCULAR | Status: AC
  Filled 2017-04-02: qty 2

## 2017-04-02 MED ORDER — HEPARIN (PORCINE) IN NACL 100-0.45 UNIT/ML-% IJ SOLN
1200.0000 [IU]/h | INTRAMUSCULAR | Status: DC
Start: 2017-04-02 — End: 2017-04-02

## 2017-04-02 MED ORDER — FENTANYL CITRATE (PF) 100 MCG/2ML IJ SOLN
INTRAMUSCULAR | Status: DC | PRN
  Administered 2017-04-02: 50 ug via INTRAVENOUS

## 2017-04-02 MED ORDER — ACETAMINOPHEN 325 MG PO TABS: 650.0000 mg | ORAL_TABLET | ORAL | Status: DC | PRN

## 2017-04-02 MED ORDER — NITROGLYCERIN IN D5W 200-5 MCG/ML-% IV SOLN: 0.0000 ug/min | INTRAVENOUS | Status: DC

## 2017-04-02 MED ORDER — SODIUM CHLORIDE 0.9% FLUSH
3.0000 mL | Freq: Two times a day (BID) | INTRAVENOUS | Status: DC
  Administered 2017-04-02: 3 mL via INTRAVENOUS

## 2017-04-02 MED ORDER — HEPARIN (PORCINE) IN NACL 2-0.9 UNIT/ML-% IJ SOLN
INTRAMUSCULAR | Status: AC
  Filled 2017-04-02: qty 1000

## 2017-04-02 MED ORDER — SODIUM CHLORIDE 0.9% FLUSH: 3.0000 mL | Freq: Two times a day (BID) | INTRAVENOUS | Status: DC

## 2017-04-02 MED ORDER — LIDOCAINE HCL (PF) 1 % IJ SOLN
INTRAMUSCULAR | Status: DC | PRN
  Administered 2017-04-02: 2 mL

## 2017-04-02 MED ORDER — ASPIRIN 300 MG RE SUPP: 300.0000 mg | RECTAL | Status: DC

## 2017-04-02 MED ORDER — NITROGLYCERIN 0.4 MG SL SUBL
0.4000 mg | SUBLINGUAL_TABLET | SUBLINGUAL | Status: DC | PRN
Start: 2017-04-02 — End: 2017-04-03

## 2017-04-02 MED ORDER — VERAPAMIL HCL 2.5 MG/ML IV SOLN
INTRAVENOUS | Status: AC
  Filled 2017-04-02: qty 2

## 2017-04-02 MED ORDER — HEPARIN SODIUM (PORCINE) 1000 UNIT/ML IJ SOLN
INTRAMUSCULAR | Status: AC
  Filled 2017-04-02: qty 1

## 2017-04-02 MED ORDER — HEPARIN (PORCINE) IN NACL 2-0.9 UNIT/ML-% IJ SOLN
INTRAMUSCULAR | Status: AC | PRN
  Administered 2017-04-02: 1000 mL via INTRA_ARTERIAL

## 2017-04-02 SURGICAL SUPPLY — 9 items
CATH OPTITORQUE JACKY 4.0 5F (CATHETERS) ×2 IMPLANT
DEVICE RAD COMP TR BAND LRG (VASCULAR PRODUCTS) ×2 IMPLANT
GLIDESHEATH SLEND SS 6F .021 (SHEATH) ×2 IMPLANT
GUIDEWIRE INQWIRE 1.5J.035X260 (WIRE) ×1 IMPLANT
INQWIRE 1.5J .035X260CM (WIRE) ×2
KIT HEART LEFT (KITS) ×2 IMPLANT
PACK CARDIAC CATHETERIZATION (CUSTOM PROCEDURE TRAY) ×2 IMPLANT
TRANSDUCER W/STOPCOCK (MISCELLANEOUS) ×2 IMPLANT
TUBING CIL FLEX 10 FLL-RA (TUBING) ×2 IMPLANT

## 2017-04-02 NOTE — H&P (View-Only) (Signed)
Progress Note  Patient Name: Alexandra Clark Date of Encounter: 04/02/2017  Primary Cardiologist: Armanda Magic MD- new  Subjective   Feels exhausted. No chest pain or SOB currently.  Inpatient Medications    Scheduled Meds: . aspirin  81 mg Oral Pre-Cath  . [START ON 04/03/2017] aspirin EC  81 mg Oral Daily  . atorvastatin  80 mg Oral q1800  . metoprolol tartrate  12.5 mg Oral BID  . potassium chloride  40 mEq Oral Once  . sodium chloride flush  3 mL Intravenous Q12H   Continuous Infusions: . sodium chloride    . sodium chloride 1 mL/kg/hr (04/02/17 0700)  . heparin    . nitroGLYCERIN     PRN Meds: sodium chloride, acetaminophen, nitroGLYCERIN, ondansetron (ZOFRAN) IV, sodium chloride flush   Vital Signs    Vitals:   04/02/17 0400 04/02/17 0500 04/02/17 0600 04/02/17 0700  BP: 116/73 112/69 122/66 123/64  Pulse: 64 65 76 69  Resp: 20 (!) 22 (!) 27 (!) 24  Temp:      TempSrc:      SpO2: 99% 95% 95% 95%  Weight:      Height:        Intake/Output Summary (Last 24 hours) at 04/02/17 0746 Last data filed at 04/02/17 0700  Gross per 24 hour  Intake           408.26 ml  Output              300 ml  Net           108.26 ml   Filed Weights   04/01/17 2055 04/02/17 0340  Weight: 223 lb (101.2 kg) 223 lb 1.7 oz (101.2 kg)    Telemetry    NSR - Personally Reviewed  ECG    NSR with nonspecific TWA, mildly prolonged QTc 480 msec - Personally Reviewed  Physical Exam   GEN: WD, obese BF in NAD.  Neck: No JVD Cardiac: RRR, no murmurs, rubs, or gallops.  Respiratory: Clear to auscultation bilaterally. GI: Soft, nontender, non-distended  MS: Legs big but no edema; No deformity. Neuro:  Nonfocal  Psych: Normal affect   Labs    Chemistry Recent Labs Lab 04/01/17 2131 04/02/17 0520  NA 135 138  K 3.0* 3.3*  CL 98* 103  CO2 27 27  GLUCOSE 148* 128*  BUN 18 13  CREATININE 1.00 0.71  CALCIUM 9.6 9.3  PROT 8.5* 7.1  ALBUMIN 4.2 3.6  AST 27 20  ALT 22  19  ALKPHOS 56 45  BILITOT 0.7 0.9  GFRNONAA >60 >60  GFRAA >60 >60  ANIONGAP 10 8     Hematology Recent Labs Lab 04/01/17 2131 04/02/17 0520  WBC 10.1 10.3  RBC 4.67 4.54  HGB 14.3 13.3  HCT 42.9 40.7  MCV 91.9 89.6  MCH 30.6 29.3  MCHC 33.3 32.7  RDW 13.2 13.0  PLT 247 264    Cardiac Enzymes Recent Labs Lab 04/01/17 2131 04/02/17 0520  TROPONINI 10.83* <0.03   No results for input(s): TROPIPOC in the last 168 hours.   BNPNo results for input(s): BNP, PROBNP in the last 168 hours.   DDimer No results for input(s): DDIMER in the last 168 hours.   Radiology    Dg Chest Port 1 View  Result Date: 04/01/2017 CLINICAL DATA:  Dyspnea and left-sided chest pain prior to arrival. EXAM: PORTABLE CHEST 1 VIEW COMPARISON:  02/05/2014 FINDINGS: The heart size and mediastinal contours are within normal limits.  Both lungs are clear. The visualized skeletal structures are unremarkable. IMPRESSION: No active disease. Electronically Signed   By: Tollie Ethavid  Kwon M.D.   On: 04/01/2017 21:54    Cardiac Studies   none  Patient Profile     56 y.o. female with history of HTN, untreated HLD, prediabetes, OSA presents with NSTEMI.   Assessment & Plan    1. NSTEMI. Troponin 10.83 yesterday - now <0.03. Ecg shows nonspecific TWA. She is currently pain free. Continue IV heparin, beta blocker, ASA, statin. Plan invasive evaluation today with cardiac cath and possible PCI. Unusual that troponin is now normal ? Spurious lab result.  2. HTN controlled. tenoretic switched to lopressor due to hypokalemia. 3. Hypokalemia. Will replete 4. Hypercholesterolemia. On high dose statin.  5. Prediabetes. 6. Obesity with OSA on CPAP.  Signed, Peter SwazilandJordan, MD  04/02/2017, 7:46 AM

## 2017-04-02 NOTE — ED Notes (Addendum)
T/c by Dr. Hyacinth MeekerMiller to Dr. Judd Lienelo and charge nurse at [facility], notified of pt arriving ED to ED per policy a no beds available

## 2017-04-02 NOTE — Progress Notes (Signed)
Normal saline @75  cc/hr- requesting food, request honored. 3 cc of air removed fromTR band for a total of 7 cc remaining in band . Offers no c/o.moniotirng

## 2017-04-02 NOTE — H&P (Addendum)
Cardiology Admission History and Physical:   Patient ID: Alexandra Clark; MRN: [id number]; DOB: September 12, 1960   Admission date: 04/01/2017  Primary Care Provider: Ignatius Specking, MD Primary Cardiologist: none (new) Primary Electrophysiologist:  none  Chief Complaint:  Chest pain  Patient Profile:   Alexandra Clark is a 56 y.o. female with a history of DJD of the cervical spine and HTN who presented today to [facility] with complaints of chest pain and SOB.  She was found to have a troponin of 10.83 c/w NSTEMI and is now transferred for further treatment.  Currently she denies any chest pain or SOB.    History of Present Illness:   Alexandra Clark is a 55 y.o. female with a history of DJD of the cervical spine and HTN who presented today to [facility] with complaints of chest pain and SOB.  Her symptoms started several hours prior to admission.  Nothing made her symptoms better and her SOB got progressively worse and she decided to go to the ER.  On the way there she developed CP that was substernal with a heaviness.  But the time she got to the ER the CP had resolved.  She has a history of DOE but is fairly sedentary.  She has chronic lymphedema and wears compression hose and takes Lasix.  She denies any nausea, diaphoresis, PND or orthopnea.  No palpitations, dizziness or syncope.  She was found to have a troponin of 10 c/w NSTEMI and is now transferred for further treatment.  Currently she denies any chest pain or SOB.     Past Medical History:  Diagnosis Date  . Carpal tunnel syndrome   . DDD (degenerative disc disease), cervical   . Hyperlipidemia   . Hypertension   . OSA on CPAP   . Pre-diabetes   . Vitamin D deficiency     Past Surgical History:  Procedure Laterality Date  . ABDOMINAL HYSTERECTOMY    . CARPAL TUNNEL RELEASE    . KNEE ARTHROSCOPY WITH ANTERIOR CRUCIATE LIGAMENT (ACL) REPAIR    . TONSILLECTOMY       Medications Prior to Admission: Prior to Admission medications    Medication Sig Start Date End Date Taking? Authorizing Provider  atenolol-chlorthalidone (TENORETIC) 50-25 MG per tablet Take 1 tablet by mouth daily. 03/03/15  Yes [provider]  Cholecalciferol (VITAMIN D3) 50000 units CAPS Take 1 capsule by mouth once a week. 02/15/17  Yes [provider]  furosemide (LASIX) 40 MG tablet Take 40 mg by mouth daily as needed for fluid. Takes in course of three days as needed for fluid retention   Yes [provider]  KLOR-CON M10 10 MEQ tablet Take 10 mEq by mouth daily as needed. In addition to Lasix (Furosemide) 03/20/15  Yes [provider]  meloxicam (MOBIC) 7.5 MG tablet Take 7.5 mg by mouth daily.  04/01/17   [provider]     Allergies:   No Known Allergies  Social History:   Social History   Social History  . Marital status: Married    Spouse name: N/A  . Number of children: N/A  . Years of education: N/A   Occupational History  . Not on file.   Social History Main Topics  . Smoking status: Never Smoker  . Smokeless tobacco: Never Used  . Alcohol use No  . Drug use: No  . Sexual activity: Not on file   Other Topics Concern  . Not on file  Social History Narrative  . No narrative on file    Family History:   The patient's family history includes CVA in her father; Diabetes in her mother; Heart attack (age of onset: 1980) in her mother; Hyperlipidemia in her father and mother; Hypertension in her father and mother.    ROS:  Please see the history of present illness.  All other ROS reviewed and negative.     Physical Exam/Data:   Vitals:   04/02/17 0006 04/02/17 0030 04/02/17 0100 04/02/17 0130  BP: 130/79 135/76 (!) 115/58 131/77  Pulse: 87 84 81 84  Resp: (!) 26 20 18  (!) 24  Temp:      TempSrc:      SpO2: 97% 99% 95% 97%  Weight:      Height:       No intake or output data in the 24 hours ending 04/02/17 0318 Filed Weights   04/01/17 2055  Weight: 223 lb (101.2 kg)   Body  mass index is 36.54 kg/m.  General:  Well nourished, well developed, in no acute distress HEENT: normal Lymph: no adenopathy Neck: no JVD Endocrine:  No thryomegaly Vascular: No carotid bruits; FA pulses 2+ bilaterally without bruits  Cardiac:  normal S1, S2; RRR; no murmur  Lungs:  clear to auscultation bilaterally, no wheezing, rhonchi or rales  Abd: soft, nontender, no hepatomegaly  Ext: chronic lymphedema Musculoskeletal:  No deformities, BUE and BLE strength normal and equal Skin: warm and dry  Neuro:  CNs 2-12 intact, no focal abnormalities noted Psych:  Normal affect    EKG:  The ECG that was done and was personally reviewed and demonstrates NSR with nonspecific ST abnormality and prolonged QTc  Relevant CV Studies: none  Laboratory Data:  Chemistry  Recent Labs Lab 04/01/17 2131  NA 135  K 3.0*  CL 98*  CO2 27  GLUCOSE 148*  BUN 18  CREATININE 1.00  CALCIUM 9.6  GFRNONAA >60  GFRAA >60  ANIONGAP 10     Recent Labs Lab 04/01/17 2131  PROT 8.5*  ALBUMIN 4.2  AST 27  ALT 22  ALKPHOS 56  BILITOT 0.7   Hematology  Recent Labs Lab 04/01/17 2131  WBC 10.1  RBC 4.67  HGB 14.3  HCT 42.9  MCV 91.9  MCH 30.6  MCHC 33.3  RDW 13.2  PLT 247   Cardiac Enzymes  Recent Labs Lab 04/01/17 2131  TROPONINI 10.83*   No results for input(s): TROPIPOC in the last 168 hours.  BNPNo results for input(s): BNP, PROBNP in the last 168 hours.  DDimer No results for input(s): DDIMER in the last 168 hours.  Radiology/Studies:  Dg Chest Port 1 View  Result Date: 04/01/2017 CLINICAL DATA:  Dyspnea and left-sided chest pain prior to arrival. EXAM: PORTABLE CHEST 1 VIEW COMPARISON:  02/05/2014 FINDINGS: The heart size and mediastinal contours are within normal limits. Both lungs are clear. The visualized skeletal structures are unremarkable. IMPRESSION: No active disease. Electronically Signed   By: Tollie Ethavid  Kwon M.D.   On: 04/01/2017 21:54    Assessment and Plan:    1. NSTEMI - she had SOB which started earlier today and then had chest pain that lasted on the car ride to the ER.  Troponin elevated at 10.83.  Currently pain free.  EKG with nonspecific ST abnormality.   - admit to tele bed - cycle trop - IV Heparin per pharmacy - IV NTG gtt - ASA 81mg  daily - high dose statin with Lipitor 80mg   daily - Hold Atenoretic - Start Lopressor 12.5mg  BID - 2D echo in am to assess LVF - NPO for cath in am - Left heart cath in am - Cardiac catheterization was discussed with the patient fully. The patient understands that risks include but are not limited to stroke (1 in 1000), death (1 in 1000), kidney failure [usually temporary] (1 in 500), bleeding (1 in 200), allergic reaction [possibly serious] (1 in 200).  The patient understands and is willing to proceed.    2.  Hypokalemia - replete  - repeat BMET in am  3.  Prolonged QT in the setting of hypokalemia - repeat EKG in am once potassium repleted  4.  HTN - BP currently controlled - diuretic on hold for cath - start on Lopressor 12.5mg  BID  5.  Hyperlipidemia - start Lipitor 80mg  daily  6.  Pre dm - check HbA1C  7.  OSA - CPAP per respiratory at night   Severity of Illness: The appropriate patient status for this patient is OBSERVATION. Observation status is judged to be reasonable and necessary in order to provide the required intensity of service to ensure the patient's safety. The patient's presenting symptoms, physical exam findings, and initial radiographic and laboratory data in the context of their medical condition is felt to place them at decreased risk for further clinical deterioration. Furthermore, it is anticipated that the patient will be medically stable for discharge from the hospital within 2 midnights of admission. The following factors support the patient status of observation.   " The patient's presenting symptoms include chest pain and SOB. " The physical exam findings  include LE edema. " The initial radiographic and laboratory data are elevated trop at 10.83 and low potassium.     Signed, Armanda Magic, MD  04/02/2017 3:18 AM

## 2017-04-02 NOTE — Plan of Care (Signed)
Problem: Safety: Goal: Ability to remain free from injury will improve Outcome: Progressing Patient and RN reviewed safety and the need to call for help before ambulation. We also discussed the affects of medication on balance and blood pressure. Patient verbalizes understanding.

## 2017-04-02 NOTE — Progress Notes (Signed)
Patient off unit to cath lab.

## 2017-04-02 NOTE — Progress Notes (Signed)
ANTICOAGULATION CONSULT NOTE - Follow-up Consult  Pharmacy Consult for heparin  Indication: chest pain/ACS  No Known Allergies  Patient Measurements: Height: 5' 5.5" (166.4 cm) Weight: 223 lb (101.2 kg) IBW/kg (Calculated) : 58.15 Heparin Dosing Weight: 81 kg   Vital Signs: Temp: 98.7 F (37.1 C) (08/07 2157) Temp Source: Oral (08/07 2157) BP: 131/77 (08/08 0130) Pulse Rate: 84 (08/08 0130)  Labs:  Recent Labs  04/01/17 2131  HGB 14.3  HCT 42.9  PLT 247  CREATININE 1.00  TROPONINI 10.83*    Estimated Creatinine Clearance: 76.6 mL/min (by C-G formula based on SCr of 1 mg/dL).   Medical History: Past Medical History:  Diagnosis Date  . Carpal tunnel syndrome   . DDD (degenerative disc disease), cervical   . Hyperlipidemia   . Hypertension   . OSA on CPAP   . Pre-diabetes   . Vitamin D deficiency     Assessment: 56 yo female transferred from [facility] ED with chest pain. Heparin gtt started at [facility] by Dr. Hyacinth MeekerMiller at rate of 12 units/kg/hr after a one-time 4000 unit bolus.   Patient is not on oral anticoagulation PTA. Pharmacy consulted to continue dosing heparin gtt. Heparin level therapeutic at 0.48. CBC stable and no overt s/s bleeding noted.   Goal of Therapy:  Heparin level 0.3-0.7 units/ml Monitor platelets by anticoagulation protocol: Yes   Plan:  Continue heparin gtt at 1200 units/hr  Confirm heparin level in 6 hrs  Daily heparin level and CBC Monitor for s/s bleeding F/u cards plans   York CeriseKatherine Cook, PharmD Clinical Pharmacist 04/02/17 6:42 AM

## 2017-04-02 NOTE — Progress Notes (Addendum)
Progress Note  Patient Name: BLANCH STANG Date of Encounter: 04/02/2017  Primary Cardiologist: Armanda Magic MD- new  Subjective   Feels exhausted. No chest pain or SOB currently.  Inpatient Medications    Scheduled Meds: . aspirin  81 mg Oral Pre-Cath  . [START ON 04/03/2017] aspirin EC  81 mg Oral Daily  . atorvastatin  80 mg Oral q1800  . metoprolol tartrate  12.5 mg Oral BID  . potassium chloride  40 mEq Oral Once  . sodium chloride flush  3 mL Intravenous Q12H   Continuous Infusions: . sodium chloride    . sodium chloride 1 mL/kg/hr (04/02/17 0700)  . heparin    . nitroGLYCERIN     PRN Meds: sodium chloride, acetaminophen, nitroGLYCERIN, ondansetron (ZOFRAN) IV, sodium chloride flush   Vital Signs    Vitals:   04/02/17 0400 04/02/17 0500 04/02/17 0600 04/02/17 0700  BP: 116/73 112/69 122/66 123/64  Pulse: 64 65 76 69  Resp: 20 (!) 22 (!) 27 (!) 24  Temp:      TempSrc:      SpO2: 99% 95% 95% 95%  Weight:      Height:        Intake/Output Summary (Last 24 hours) at 04/02/17 0746 Last data filed at 04/02/17 0700  Gross per 24 hour  Intake           408.26 ml  Output              300 ml  Net           108.26 ml   Filed Weights   04/01/17 2055 04/02/17 0340  Weight: 223 lb (101.2 kg) 223 lb 1.7 oz (101.2 kg)    Telemetry    NSR - Personally Reviewed  ECG    NSR with nonspecific TWA, mildly prolonged QTc 480 msec - Personally Reviewed  Physical Exam   GEN: WD, obese BF in NAD.  Neck: No JVD Cardiac: RRR, no murmurs, rubs, or gallops.  Respiratory: Clear to auscultation bilaterally. GI: Soft, nontender, non-distended  MS: Legs big but no edema; No deformity. Neuro:  Nonfocal  Psych: Normal affect   Labs    Chemistry Recent Labs Lab 04/01/17 2131 04/02/17 0520  NA 135 138  K 3.0* 3.3*  CL 98* 103  CO2 27 27  GLUCOSE 148* 128*  BUN 18 13  CREATININE 1.00 0.71  CALCIUM 9.6 9.3  PROT 8.5* 7.1  ALBUMIN 4.2 3.6  AST 27 20  ALT 22  19  ALKPHOS 56 45  BILITOT 0.7 0.9  GFRNONAA >60 >60  GFRAA >60 >60  ANIONGAP 10 8     Hematology Recent Labs Lab 04/01/17 2131 04/02/17 0520  WBC 10.1 10.3  RBC 4.67 4.54  HGB 14.3 13.3  HCT 42.9 40.7  MCV 91.9 89.6  MCH 30.6 29.3  MCHC 33.3 32.7  RDW 13.2 13.0  PLT 247 264    Cardiac Enzymes Recent Labs Lab 04/01/17 2131 04/02/17 0520  TROPONINI 10.83* <0.03   No results for input(s): TROPIPOC in the last 168 hours.   BNPNo results for input(s): BNP, PROBNP in the last 168 hours.   DDimer No results for input(s): DDIMER in the last 168 hours.   Radiology    Dg Chest Port 1 View  Result Date: 04/01/2017 CLINICAL DATA:  Dyspnea and left-sided chest pain prior to arrival. EXAM: PORTABLE CHEST 1 VIEW COMPARISON:  02/05/2014 FINDINGS: The heart size and mediastinal contours are within normal limits.  Both lungs are clear. The visualized skeletal structures are unremarkable. IMPRESSION: No active disease. Electronically Signed   By: Tollie Ethavid  Kwon M.D.   On: 04/01/2017 21:54    Cardiac Studies   none  Patient Profile     56 y.o. female with history of HTN, untreated HLD, prediabetes, OSA presents with NSTEMI.   Assessment & Plan    1. NSTEMI. Troponin 10.83 yesterday - now <0.03. Ecg shows nonspecific TWA. She is currently pain free. Continue IV heparin, beta blocker, ASA, statin. Plan invasive evaluation today with cardiac cath and possible PCI. Unusual that troponin is now normal ? Spurious lab result.  2. HTN controlled. tenoretic switched to lopressor due to hypokalemia. 3. Hypokalemia. Will replete 4. Hypercholesterolemia. On high dose statin.  5. Prediabetes. 6. Obesity with OSA on CPAP.  Signed, Annayah Worthley [place], MD  04/02/2017, 7:46 AM

## 2017-04-02 NOTE — Interval H&P Note (Signed)
Cath Lab Visit (complete for each Cath Lab visit)  Clinical Evaluation Leading to the Procedure:   ACS: Yes.    Non-ACS:  n/a       History and Physical Interval Note:  04/02/2017 11:42 AM  Alexandra Clark  has presented today for surgery, with the diagnosis of NSTEMI  The various methods of treatment have been discussed with the patient and family. After consideration of risks, benefits and other options for treatment, the patient has consented to  Procedure(s): LEFT HEART CATH AND CORONARY ANGIOGRAPHY (N/A) as a surgical intervention .  The patient's history has been reviewed, patient examined, no change in status, stable for surgery.  I have reviewed the patient's chart and labs.  Questions were answered to the patient's satisfaction.     Lorine BearsMuhammad Pablo Mathurin

## 2017-04-02 NOTE — Progress Notes (Signed)
Had 5 run of PVC's ,while resting on bed . Denies any pain or discomforts.kept monitored, B. Bhagat notified

## 2017-04-02 NOTE — ED Notes (Signed)
Report to Ryan, [facility] 

## 2017-04-02 NOTE — Care Management Note (Addendum)
Case Management Note  Patient Details  Name: Alexandra Clark MRN: [id number] Date of Birth: 06-11-1961  Subjective/Objective:  From home with spouse,Presents with NSTEMI, S/p left heart cath.                   Action/Plan: NCM will follow for dc needs.   Expected Discharge Date:                  Expected Discharge Plan:     In-House Referral:     Discharge planning Services  CM Consult  Post Acute Care Choice:    Choice offered to:     DME Arranged:    DME Agency:     HH Arranged:    HH Agency:     Status of Service:  In process, will continue to follow  If discussed at Long Length of Stay Meetings, dates discussed:    Additional Comments:  Leone Havenaylor, Staysha Truby Clinton, RN 04/02/2017, 9:29 PM

## 2017-04-03 ENCOUNTER — Observation Stay (HOSPITAL_BASED_OUTPATIENT_CLINIC_OR_DEPARTMENT_OTHER): Payer: BLUE CROSS/BLUE SHIELD

## 2017-04-03 DIAGNOSIS — G4733 Obstructive sleep apnea (adult) (pediatric): Secondary | ICD-10-CM | POA: Diagnosis not present

## 2017-04-03 DIAGNOSIS — I361 Nonrheumatic tricuspid (valve) insufficiency: Secondary | ICD-10-CM

## 2017-04-03 DIAGNOSIS — E559 Vitamin D deficiency, unspecified: Secondary | ICD-10-CM | POA: Diagnosis not present

## 2017-04-03 DIAGNOSIS — Z9989 Dependence on other enabling machines and devices: Secondary | ICD-10-CM

## 2017-04-03 DIAGNOSIS — E7800 Pure hypercholesterolemia, unspecified: Secondary | ICD-10-CM | POA: Diagnosis not present

## 2017-04-03 DIAGNOSIS — R0789 Other chest pain: Secondary | ICD-10-CM | POA: Diagnosis not present

## 2017-04-03 DIAGNOSIS — I214 Non-ST elevation (NSTEMI) myocardial infarction: Secondary | ICD-10-CM | POA: Diagnosis not present

## 2017-04-03 DIAGNOSIS — G5600 Carpal tunnel syndrome, unspecified upper limb: Secondary | ICD-10-CM | POA: Diagnosis not present

## 2017-04-03 DIAGNOSIS — I10 Essential (primary) hypertension: Secondary | ICD-10-CM | POA: Diagnosis not present

## 2017-04-03 LAB — GLUCOSE, CAPILLARY
GLUCOSE-CAPILLARY: 80 mg/dL (ref 65–99)
Glucose-Capillary: 92 mg/dL (ref 65–99)

## 2017-04-03 LAB — CBC
HEMATOCRIT: 38.1 % (ref 36.0–46.0)
Hemoglobin: 12.5 g/dL (ref 12.0–15.0)
MCH: 29.9 pg (ref 26.0–34.0)
MCHC: 32.8 g/dL (ref 30.0–36.0)
MCV: 91.1 fL (ref 78.0–100.0)
Platelets: 263 10*3/uL (ref 150–400)
RBC: 4.18 MIL/uL (ref 3.87–5.11)
RDW: 13.3 % (ref 11.5–15.5)
WBC: 13.5 10*3/uL — ABNORMAL HIGH (ref 4.0–10.5)

## 2017-04-03 LAB — ECHOCARDIOGRAM COMPLETE
HEIGHTINCHES: 65 in
Weight: 3569.69 oz

## 2017-04-03 LAB — TROPONIN I

## 2017-04-03 MED ORDER — HYDROCHLOROTHIAZIDE 25 MG PO TABS
25.0000 mg | ORAL_TABLET | Freq: Every day | ORAL | Status: DC
Start: 2017-04-03 — End: 2017-04-03
  Administered 2017-04-03: 11:00:00 25 mg via ORAL
  Filled 2017-04-03: qty 1

## 2017-04-03 MED ORDER — POTASSIUM CHLORIDE CRYS ER 20 MEQ PO TBCR: 20.0000 meq | EXTENDED_RELEASE_TABLET | Freq: Every day | ORAL | 6 refills | Status: DC

## 2017-04-03 MED ORDER — ATENOLOL 50 MG PO TABS
50.0000 mg | ORAL_TABLET | Freq: Every day | ORAL | Status: DC
  Administered 2017-04-03: 50 mg via ORAL
  Filled 2017-04-03: qty 1

## 2017-04-03 MED ORDER — POTASSIUM CHLORIDE CRYS ER 20 MEQ PO TBCR
20.0000 meq | EXTENDED_RELEASE_TABLET | Freq: Every day | ORAL | Status: DC
  Administered 2017-04-03: 20 meq via ORAL
  Filled 2017-04-03: qty 1

## 2017-04-03 MED ORDER — ANGIOPLASTY BOOK
Freq: Once | Status: DC
  Filled 2017-04-03: qty 1

## 2017-04-03 MED ORDER — ATORVASTATIN CALCIUM 80 MG PO TABS: 80.0000 mg | ORAL_TABLET | Freq: Every day | ORAL | Status: DC

## 2017-04-03 NOTE — Progress Notes (Signed)
  2D Echocardiogram has been performed.  Alexandra Clark T France Lusty 04/03/2017, 9:40 AM

## 2017-04-03 NOTE — Progress Notes (Addendum)
Progress Note  Patient Name: Alexandra Clark Date of Encounter: 04/03/2017  Primary Cardiologist: New to Dr. Mayford Knife  (likley follow up in [place] if needed)  Subjective   Feeling well. No chest pain, sob or palpitations.   Inpatient Medications    Scheduled Meds: . angioplasty book   Does not apply Once  . aspirin EC  81 mg Oral Daily  . metoprolol tartrate  25 mg Oral BID  . sodium chloride flush  3 mL Intravenous Q12H   Continuous Infusions: . sodium chloride     PRN Meds: sodium chloride, acetaminophen, nitroGLYCERIN, ondansetron (ZOFRAN) IV, sodium chloride flush   Vital Signs    Vitals:   04/02/17 2004 04/02/17 2129 04/03/17 0030 04/03/17 0251  BP: (!) 109/47 (!) 111/44 (!) 125/54 (!) 113/56  Pulse: 76 66 62 73  Resp: (!) 26 (!) 23 20 19   Temp: 98.1 F (36.7 C)   97.6 F (36.4 C)  TempSrc: Oral   Oral  SpO2: 97% 97% 98% 98%  Weight:      Height:        Intake/Output Summary (Last 24 hours) at 04/03/17 0801 Last data filed at 04/03/17 0254  Gross per 24 hour  Intake            226.4 ml  Output             1400 ml  Net          -1173.6 ml   Filed Weights   04/01/17 2055 04/02/17 0340  Weight: 223 lb (101.2 kg) 223 lb 1.7 oz (101.2 kg)    Telemetry    NSR at rate of 70s - Personally Reviewed  ECG    SR with non specific TW abnormality - Personally Reviewed  Physical Exam   GEN: No acute distress.   Neck: No JVD Cardiac: RRR, no murmurs, rubs, or gallops. R radial cath site without hematoma.  Respiratory: Clear to auscultation bilaterally. GI: Soft, nontender, non-distended  MS: No edema; No deformity. Neuro:  Nonfocal  Psych: Normal affect   Labs    Chemistry Recent Labs Lab 04/01/17 2131 04/02/17 0520  NA 135 138  K 3.0* 3.3*  CL 98* 103  CO2 27 27  GLUCOSE 148* 128*  BUN 18 13  CREATININE 1.00 0.71  CALCIUM 9.6 9.3  PROT 8.5* 7.1  ALBUMIN 4.2 3.6  AST 27 20  ALT 22 19  ALKPHOS 56 45  BILITOT 0.7 0.9  GFRNONAA >60  >60  GFRAA >60 >60  ANIONGAP 10 8     Hematology Recent Labs Lab 04/01/17 2131 04/02/17 0520 04/03/17 0406  WBC 10.1 10.3 13.5*  RBC 4.67 4.54 4.18  HGB 14.3 13.3 12.5  HCT 42.9 40.7 38.1  MCV 91.9 89.6 91.1  MCH 30.6 29.3 29.9  MCHC 33.3 32.7 32.8  RDW 13.2 13.0 13.3  PLT 247 264 263    Cardiac Enzymes Recent Labs Lab 04/01/17 2131 04/02/17 0520 04/02/17 0946 04/02/17 1450  TROPONINI 10.83* <0.03 <0.03 0.08*   No results for input(s): TROPIPOC in the last 168 hours.   BNPNo results for input(s): BNP, PROBNP in the last 168 hours.   DDimer No results for input(s): DDIMER in the last 168 hours.   Radiology    Dg Chest Port 1 View  Result Date: 04/01/2017 CLINICAL DATA:  Dyspnea and left-sided chest pain prior to arrival. EXAM: PORTABLE CHEST 1 VIEW COMPARISON:  02/05/2014 FINDINGS: The heart size and mediastinal contours are within normal  limits. Both lungs are clear. The visualized skeletal structures are unremarkable. IMPRESSION: No active disease. Electronically Signed   By: Tollie Ethavid  Kwon M.D.   On: 04/01/2017 21:54    Cardiac Studies   LEFT HEART CATH AND CORONARY ANGIOGRAPHY  04/02/17  Conclusion     The left ventricular systolic function is normal.  LV end diastolic pressure is mildly elevated.  The left ventricular ejection fraction is 55-65% by visual estimate.  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   1. Near normal coronary arteries. 2. Normal LV systolic function and wall motion. Mildly elevated left ventricular end-diastolic pressure.  Recommendations: I suspect that the initial elevated troponin was a lab error as subsequent troponins were negative. Chest pain is likely noncardiac.   Pending echo  Patient Profile     Alexandra Clark is a 56 y.o. female with a history of DJD of the cervical spine, OSA and HTN who [facility] with complaints of chest pain and SOB.  She was found to have a troponin of 10.83 c/w NSTEMI and is now  transferred for further treatment.    Assessment & Plan    1. NSTEMI - Troponin of 10.83 at outside hospital (likely error). Here trend < 0.03-->0.08. Remained cath pain free since admission. Cath with near normal coronaries. Mild elevated LVEDP. Pending eho. Ambulate.   2. HTN - Stable on current medication.   She will likely follow up @ [place] due to demographic otherwise PCP given no CAD.   3. HLD LDL 175. Start lipitor 80 mg daily  Signed, CarpenterBhagat,Bhavinkumar, [place]  04/03/2017, 8:01 AM    Patient seen and examined and history reviewed. Agree with above findings and plan. Feels very well. No chest pain or dyspnea. VSS. Echo is pending.  We can resume Tenoretic at discharge but she will need to take a daily potassium supplement 20 meq daily. Plan on DC today after Echo done.  Jameil Whitmoyer [place], MDFACC 04/03/2017 8:56 AM

## 2017-04-03 NOTE — Discharge Summary (Signed)
Discharge Summary    Patient ID: Alexandra Clark,  MRN: [id number], DOB/AGE: 55-Jun-1962 55 y.o.  Admit date: 04/01/2017 Discharge date: 04/03/2017  Primary Care Provider: Ignatius Specking Primary Cardiologist: New to Dr. Mayford Knife  (likley follow up in [place] if needed)  Discharge Diagnoses    Principal Problem:   NSTEMI (non-ST elevated myocardial infarction) [facility]) Active Problems:   Pre-diabetes   OSA on CPAP   Vitamin D deficiency   Carpal tunnel syndrome   Essential hypertension   Pure hypercholesterolemia   Other chest pain   Hypokalemia   Low TSH   Pre-diabetic  Allergies No Known Allergies  Diagnostic Studies/Procedures    LEFT HEART CATH AND CORONARY ANGIOGRAPHY  04/02/17  Conclusion     The left ventricular systolic function is normal.  LV end diastolic pressure is mildly elevated.  The left ventricular ejection fraction is 55-65% by visual estimate.  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  1. Near normal coronary arteries. 2. Normal LV systolic function and wall motion. Mildly elevated left ventricular end-diastolic pressure.  Recommendations: I suspect that the initial elevated troponin was a lab error as subsequent troponins were negative. Chest pain is likely noncardiac.     Echo 04/03/17  Study Conclusions  - Left ventricle: The cavity size was normal. Systolic function was   vigorous. The estimated ejection fraction was in the range of 65%   to 70%. Wall motion was normal; there were no regional wall   motion abnormalities. Left ventricular diastolic function   parameters were normal. - Aortic valve: Trileaflet; mildly thickened, mildly calcified   leaflets. Peak gradient (S): 14 mm Hg. Valve area (Vmax): 1.81   cm^2. - Aortic root: The aortic root was normal in size. - Left atrium: The atrium was normal in size. - Right ventricle: Systolic function was normal. - Tricuspid valve: There was mild regurgitation. - Pulmonic valve: There was no  regurgitation. - Pulmonary arteries: Systolic pressure was mildly increased. PA   peak pressure: 31 mm Hg (S). - Inferior vena cava: The vessel was normal in size. - Pericardium, extracardiac: There was no pericardial effusion.   History of Present Illness     Alexandra Clark a 56 y.o.femalewith a history of DJD of the cervical spine, OSA and HTN who presented today to [facility] with complaints of chest pain and SOB. She was found to have a troponin of 10.83 c/w NSTEMI and is now transferred for further treatment.    Hospital Course     Consultants: None   1. NSTEMI - Troponin of 10.83 at outside hospital (which is correct to <0.03 at time of discharge). Here trend < 0.03 x 2 -->0.08. Remained cath pain free since admission. Cath with near normal coronaries. Mild elevated LVEDP.Echo showed LVEf of 65-70%, no wm abnormality, mild pHTN. Encouraged regular exercise and weight loss.  ambulated without angina. Advised to f/u with PCP for non cardiac evaluation of chest pain. Cardiology f/u PRN.   2. HTN - Stable on Atenolol/HCTZ. Will resume with addition of Kdur due to hypokalemia.   3. HLD  - LDL 175. treated with statin while here. Diet and exercise. Statin per PCP.   4. Low TSH - TSH of 0.33. F/u with PCP.   5. Pre-diabetic - A1c of 6.1. Encouraged lifestyle modification.   The patient has been seen by Dr. [place]  today and deemed ready for discharge home. All follow-up appointments have been scheduled. Discharge medications are listed below.  _____________   Discharge Vitals Blood pressure 136/79, pulse 62, temperature (!) 97.5 F (36.4 C), temperature source Oral, resp. rate (!) 23, height 5\' 5"  (1.651 m), weight 223 lb 1.7 oz (101.2 kg), SpO2 98 %.  Filed Weights   04/01/17 2055 04/02/17 0340  Weight: 223 lb (101.2 kg) 223 lb 1.7 oz (101.2 kg)    Labs & Radiologic Studies     CBC  Recent Labs  04/01/17 2131 04/02/17 0520 04/03/17 0406  WBC 10.1 10.3  13.5*  NEUTROABS 7.2  --   --   HGB 14.3 13.3 12.5  HCT 42.9 40.7 38.1  MCV 91.9 89.6 91.1  PLT 247 264 263   Basic Metabolic Panel  Recent Labs  04/01/17 2131 04/02/17 0520  NA 135 138  K 3.0* 3.3*  CL 98* 103  CO2 27 27  GLUCOSE 148* 128*  BUN 18 13  CREATININE 1.00 0.71  CALCIUM 9.6 9.3  MG  --  2.1   Liver Function Tests  Recent Labs  04/01/17 2131 04/02/17 0520  AST 27 20  ALT 22 19  ALKPHOS 56 45  BILITOT 0.7 0.9  PROT 8.5* 7.1  ALBUMIN 4.2 3.6   No results for input(s): LIPASE, AMYLASE in the last 72 hours. Cardiac Enzymes  Recent Labs  04/02/17 0520 04/02/17 0946 04/02/17 1450  TROPONINI <0.03 <0.03 0.08*   BNP Invalid input(s): POCBNP D-Dimer No results for input(s): DDIMER in the last 72 hours. Hemoglobin A1C  Recent Labs  04/02/17 0520  HGBA1C 6.1*   Fasting Lipid Panel  Recent Labs  04/02/17 0520  CHOL 233*  HDL 54  LDLCALC 173*  TRIG 32  CHOLHDL 4.3   Thyroid Function Tests  Recent Labs  04/02/17 0520  TSH 0.333*    Dg Chest Port 1 View  Result Date: 04/01/2017 CLINICAL DATA:  Dyspnea and left-sided chest pain prior to arrival. EXAM: PORTABLE CHEST 1 VIEW COMPARISON:  02/05/2014 FINDINGS: The heart size and mediastinal contours are within normal limits. Both lungs are clear. The visualized skeletal structures are unremarkable. IMPRESSION: No active disease. Electronically Signed   By: Tollie Ethavid  Kwon M.D.   On: 04/01/2017 21:54    Disposition   Pt is being discharged home today in good condition.  Follow-up Plans & Appointments    Follow-up Information    Vyas, Dhruv B, MD Follow up in 1 week(s).   Specialty:  Internal Medicine Why:  for post hosptial and non cardiac evalaution of chest pain  Contact information: [address] [place] [id number] [phone]          Discharge Instructions    Diet - low sodium heart healthy    Complete by:  As directed    Discharge instructions    Complete by:  As directed     No driving for 48 hours. No lifting over 5 lbs for 1 week. No sexual activity for 1 week. You may return to work on 04/07/17. Keep procedure site clean & dry. If you notice increased pain, swelling, bleeding or pus, call/return!  You may shower, but no soaking baths/hot tubs/pools for 1 week.   F/u with PCP with [facility] in 1 week. Loose weight and regular exercise.   Increase activity slowly    Complete by:  As directed       Discharge Medications   Current Discharge Medication List    CONTINUE these medications which have CHANGED   Details  potassium chloride SA (K-DUR,KLOR-CON) 20 MEQ tablet Take  1 tablet (20 mEq total) by mouth daily. Qty: 30 tablet, Refills: 6      CONTINUE these medications which have NOT CHANGED   Details  atenolol-chlorthalidone (TENORETIC) 50-25 MG per tablet Take 1 tablet by mouth daily.    furosemide (LASIX) 40 MG tablet Take 40 mg by mouth daily as needed for fluid. Takes in course of three days as needed for fluid retention    meloxicam (MOBIC) 7.5 MG tablet Take 7.5 mg by mouth daily.       STOP taking these medications     Cholecalciferol (VITAMIN D3) 50000 units CAPS            Outstanding Labs/Studies   None  Duration of Discharge Encounter   Greater than 30 minutes including physician time.  Signed, Amaris Garrette PA-C 04/03/2017, 1:41 PM

## 2017-04-03 NOTE — Progress Notes (Signed)
Call from [facility] received regarding troponin result correction  from 10.8 to <0.3 ng/ml, (see lab result) V. Bhagat PA notified, will relay to MD.

## 2017-07-22 ENCOUNTER — Other Ambulatory Visit: Payer: Self-pay | Admitting: Internal Medicine

## 2017-07-22 ENCOUNTER — Other Ambulatory Visit: Payer: Self-pay

## 2017-07-22 DIAGNOSIS — Z139 Encounter for screening, unspecified: Secondary | ICD-10-CM

## 2017-08-20 ENCOUNTER — Ambulatory Visit: Payer: BLUE CROSS/BLUE SHIELD

## 2018-01-14 ENCOUNTER — Other Ambulatory Visit: Payer: Self-pay | Admitting: Obstetrics and Gynecology

## 2018-01-16 NOTE — Patient Instructions (Addendum)
Your procedure is scheduled on:  Wednesday, June 5  Enter through the [employer] of [facility] at 8:30 am  Pick up the phone at the desk and dial [phone].  Call this number if you have problems the morning of surgery: [phone].  Remember: Do NOT eat food or Do NOT drink clear liquids (including water) after midnight Tuesday  Take these medicines the morning of surgery with a SIP OF WATER: atenolol, K-Dur.  Bring CPAP machine with you on day of surgery.  Stop herbal medications, vitamin supplements, Ibuprofen/NSAIDS 1 week prior to surgery.   Do NOT wear jewelry (body piercing), metal hair clips/bobby pins, make-up, or nail polish. Do NOT wear lotions, powders, or perfumes.  You may wear deoderant. Do NOT shave for 48 hours prior to surgery. Do NOT bring valuables to the hospital.  Leave suitcase in car.  After surgery it may be brought to your room.  For patients admitted to the hospital, checkout time is 11:00 AM the day of discharge.  Home with Husband Dorene Sorrow cell [phone] or Daughter Charise Killian cell [phone].

## 2018-01-21 ENCOUNTER — Encounter (HOSPITAL_COMMUNITY): Payer: Self-pay

## 2018-01-21 ENCOUNTER — Other Ambulatory Visit: Payer: Self-pay

## 2018-01-21 ENCOUNTER — Encounter (HOSPITAL_COMMUNITY)
Admission: RE | Admit: 2018-01-21 | Discharge: 2018-01-21 | Disposition: A | Discharge status: Home / Self Care | Payer: BLUE CROSS/BLUE SHIELD | Source: Ambulatory Visit | Attending: Obstetrics and Gynecology | Admitting: Obstetrics and Gynecology

## 2018-01-21 DIAGNOSIS — Z01812 Encounter for preprocedural laboratory examination: Secondary | ICD-10-CM | POA: Diagnosis present

## 2018-01-21 DIAGNOSIS — N393 Stress incontinence (female) (male): Secondary | ICD-10-CM | POA: Diagnosis not present

## 2018-01-21 HISTORY — DX: Other specified postprocedural states: Z98.890

## 2018-01-21 HISTORY — DX: Herpesviral infection, unspecified: B00.9

## 2018-01-21 HISTORY — DX: Depression, unspecified: F32.A

## 2018-01-21 HISTORY — DX: Other seasonal allergic rhinitis: J30.2

## 2018-01-21 HISTORY — DX: Nausea with vomiting, unspecified: R11.2

## 2018-01-21 HISTORY — DX: Unspecified osteoarthritis, unspecified site: M19.90

## 2018-01-21 HISTORY — DX: Major depressive disorder, single episode, unspecified: F32.9

## 2018-01-21 LAB — CBC
HEMATOCRIT: 40.5 % (ref 36.0–46.0)
Hemoglobin: 13.2 g/dL (ref 12.0–15.0)
MCH: 30.6 pg (ref 26.0–34.0)
MCHC: 32.6 g/dL (ref 30.0–36.0)
MCV: 94 fL (ref 78.0–100.0)
PLATELETS: 242 10*3/uL (ref 150–400)
RBC: 4.31 MIL/uL (ref 3.87–5.11)
RDW: 13 % (ref 11.5–15.5)
WBC: 10.3 10*3/uL (ref 4.0–10.5)

## 2018-01-21 LAB — BASIC METABOLIC PANEL
Anion gap: 12 (ref 5–15)
BUN: 12 mg/dL (ref 6–20)
CO2: 29 mmol/L (ref 22–32)
CREATININE: 0.79 mg/dL (ref 0.44–1.00)
Calcium: 9 mg/dL (ref 8.9–10.3)
Chloride: 97 mmol/L — ABNORMAL LOW (ref 101–111)
GFR calc non Af Amer: >60 mL/min (ref >60)
Glucose, Bld: 114 mg/dL — ABNORMAL HIGH (ref 65–99)
Potassium: 2.7 mmol/L — CL (ref 3.5–5.1)
Sodium: 138 mmol/L (ref 135–145)

## 2018-01-21 MED ORDER — BUPIVACAINE HCL (PF) 0.25 % IJ SOLN
INTRAMUSCULAR | Status: AC
  Filled 2018-01-21: qty 30

## 2018-01-21 NOTE — Pre-Procedure Instructions (Addendum)
Patient has a critical potassium lab value 2.7 (low).  Called Dr Chaney Malling and informed him.  Patient is on lasix 40 mg prn and atenolol-chlorthalidone 50-25 mg q day.  Per Dr Chaney Malling, patient informed to contact her PCP, Dr. Shirley Muscat to adjust or add potassium supplement.  Her surgery is 01/28/18.  I also printed out foods that are rich in potassium and reviewed them with the patient. Patient verbalized understanding.  Will draw stat potassium level on DOS.  Will advise Adrianne at MD's office to inform Dr Su Hilt.    Also informed to bring CPAP machine on DOS.

## 2018-01-21 NOTE — Pre-Procedure Instructions (Signed)
Called Dr. Su Hilt to let her know of critical lab value - potassium 2.7 (low).

## 2018-01-22 NOTE — H&P (Signed)
Alexandra Clark is a 57 y.o. female,  P: 1-0-3-1, who presents for Alexandra Clark free vaginal tape because of stress urinary incontinence. For over 3 years the patient has had worsening  urinary urgency and leaking of urine with certain activities, sneezing and coughing.  She denies any dysuria, hematuria or problems with initiating her stream.  She goes on to report regular bowel function and no symptoms of vaginitis.  The patient underwent pelvic floor physical therapy with minimal improvement of her symptoms and also urodynamics.  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  A trial of Detrol was given to determine its  effect on urinary urgency and incontinence but the patient reported little benefit and unacceptable dryness associated with its use.  A review of both medical and surgical management options were given to the patient however,  she has chosen to proceed with surgical management.   Past Medical History  OB History: G: 4   P: 1-0-3-1;  SVB: 1995, 7 lbs. 15.5 ounces  GYN History: menarche:  57 YO    Denies history of abnormal PAP smear.  Has a history of genital herpes.  Last PAP smear: 2018  Medical History: Pyelonephritis, Endometriosis,  Hypertension, Hypercholesterolemia, Pre-diabetes and Sleep Apnea  (has a C-PAP)  Surgical History: 1972 Tonsillectomy; 2004 Hysterectomy;  2005 Right ACL Repair;  2017 Bilateral Carpal Tunnel Release and Right Thumb Trigger Finger;  2018 Right Breast Biopsy (calcifications-benign) Denies  history of blood transfusions but has post operative nausea and vomiting  Family History: Diabetes Mellitus, Hypertension, Cancers (breast, prostate, bone, renal and non-Hodgkins Lymphoma), Stroke, Heart Disease, Arthritis  and Psoriasis  Social History:  Married and employed in Engineering geologist; Denies tobacco or alcohol use   Medications: Atenolol 50 mg/Chlorthalidone 25 mg daily CoQ10 daily Fish OIl  daily Furoseminde 40 mg prn Gentamicin 0.3% Eye Drops as directed Klor-Con 20 mEq daily Paroxetine 10 mg daily Rosuvastatin 5 mg daily   No Known Allergies  ROS: Admits to glasses, incontinence, urinary urgency,  occasional pedal edema (relieved with elevation and Lasix) but  denies headache, vision changes, nasal congestion, dysphagia, tinnitus, dizziness, hoarseness, cough,  chest pain, shortness of breath, nausea, vomiting, diarrhea,constipation,  urinary frequency,  dysuria, hematuria, vaginitis symptoms, pelvic pain, swelling of joints,easy bruising,  myalgias, arthralgias, skin rashes, unexplained weight loss and except as is mentioned in the history of present illness, patient's review of systems is otherwise negative.     Physical Exam  Bp: 106/50; P: 68 bpm  R: 16  O2Sat. 98% (RA); Temperature: 99.1 degree F orally; Weight: 226 lbs. 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  The patient verbalized understanding of these risks and has consented to proceed with Placement of Tension Free Vaginal Tape at [facility] [place] on January 28, 2018.   CSN# [id number]   Hervey Wedig J. Lowell Guitar, PA-C  for [employer]. Su Hilt

## 2018-01-28 ENCOUNTER — Ambulatory Visit (HOSPITAL_COMMUNITY): Payer: BLUE CROSS/BLUE SHIELD | Admitting: Anesthesiology

## 2018-01-28 ENCOUNTER — Encounter (HOSPITAL_COMMUNITY)
Admission: AD | Disposition: A | Discharge status: Home / Self Care | Payer: Self-pay | Source: Ambulatory Visit | Attending: Obstetrics and Gynecology

## 2018-01-28 ENCOUNTER — Encounter (HOSPITAL_COMMUNITY): Payer: Self-pay

## 2018-01-28 ENCOUNTER — Observation Stay (HOSPITAL_COMMUNITY)
Admission: AD | Admit: 2018-01-28 | Discharge: 2018-01-29 | Disposition: A | Discharge status: Home / Self Care | Payer: BLUE CROSS/BLUE SHIELD | Source: Ambulatory Visit | Attending: Obstetrics and Gynecology | Admitting: Obstetrics and Gynecology

## 2018-01-28 ENCOUNTER — Other Ambulatory Visit: Payer: Self-pay

## 2018-01-28 DIAGNOSIS — G709 Myoneural disorder, unspecified: Secondary | ICD-10-CM | POA: Diagnosis not present

## 2018-01-28 DIAGNOSIS — N3946 Mixed incontinence: Secondary | ICD-10-CM | POA: Diagnosis present

## 2018-01-28 DIAGNOSIS — F329 Major depressive disorder, single episode, unspecified: Secondary | ICD-10-CM | POA: Diagnosis not present

## 2018-01-28 DIAGNOSIS — M1990 Unspecified osteoarthritis, unspecified site: Secondary | ICD-10-CM | POA: Diagnosis not present

## 2018-01-28 DIAGNOSIS — R32 Unspecified urinary incontinence: Secondary | ICD-10-CM | POA: Diagnosis present

## 2018-01-28 DIAGNOSIS — I252 Old myocardial infarction: Secondary | ICD-10-CM | POA: Diagnosis not present

## 2018-01-28 DIAGNOSIS — I10 Essential (primary) hypertension: Secondary | ICD-10-CM | POA: Diagnosis not present

## 2018-01-28 DIAGNOSIS — G4730 Sleep apnea, unspecified: Secondary | ICD-10-CM | POA: Insufficient documentation

## 2018-01-28 HISTORY — PX: BLADDER SUSPENSION: SHX72

## 2018-01-28 LAB — BASIC METABOLIC PANEL
Anion gap: 12 (ref 5–15)
BUN: 14 mg/dL (ref 6–20)
CALCIUM: 9.6 mg/dL (ref 8.9–10.3)
CO2: 26 mmol/L (ref 22–32)
CREATININE: 0.85 mg/dL (ref 0.44–1.00)
Chloride: 102 mmol/L (ref 101–111)
GFR calc non Af Amer: >60 mL/min (ref >60)
Glucose, Bld: 104 mg/dL — ABNORMAL HIGH (ref 65–99)
Potassium: 3.9 mmol/L (ref 3.5–5.1)
SODIUM: 140 mmol/L (ref 135–145)

## 2018-01-28 SURGERY — TRANSVAGINAL TAPE (TVT) PROCEDURE
Anesthesia: General | Site: Pelvis | Laterality: Bilateral

## 2018-01-28 MED ORDER — OXYCODONE HCL 5 MG/5ML PO SOLN: 5.0000 mg | Freq: Once | ORAL | Status: DC | PRN

## 2018-01-28 MED ORDER — DOCUSATE SODIUM 100 MG PO CAPS
100.0000 mg | ORAL_CAPSULE | Freq: Two times a day (BID) | ORAL | Status: DC
  Administered 2018-01-28 – 2018-01-29 (×2): 100 mg via ORAL
  Filled 2018-01-28 (×2): qty 1

## 2018-01-28 MED ORDER — ATENOLOL-CHLORTHALIDONE 50-25 MG PO TABS: 1.0000 | ORAL_TABLET | Freq: Every day | ORAL | Status: DC

## 2018-01-28 MED ORDER — NALOXONE HCL 0.4 MG/ML IJ SOLN: 0.4000 mg | INTRAMUSCULAR | Status: DC | PRN

## 2018-01-28 MED ORDER — PROPOFOL 10 MG/ML IV BOLUS
INTRAVENOUS | Status: AC
  Filled 2018-01-28: qty 20

## 2018-01-28 MED ORDER — DEXAMETHASONE SODIUM PHOSPHATE 10 MG/ML IJ SOLN
INTRAMUSCULAR | Status: DC | PRN
  Administered 2018-01-28: 10 mg via INTRAVENOUS

## 2018-01-28 MED ORDER — ONDANSETRON HCL 4 MG/2ML IJ SOLN: 4.0000 mg | Freq: Four times a day (QID) | INTRAMUSCULAR | Status: DC | PRN

## 2018-01-28 MED ORDER — VASOPRESSIN 20 UNIT/ML IV SOLN
INTRAVENOUS | Status: AC
  Filled 2018-01-28: qty 1

## 2018-01-28 MED ORDER — DIPHENHYDRAMINE HCL 50 MG/ML IJ SOLN: 12.5000 mg | Freq: Four times a day (QID) | INTRAMUSCULAR | Status: DC | PRN

## 2018-01-28 MED ORDER — FENTANYL CITRATE (PF) 250 MCG/5ML IJ SOLN
INTRAMUSCULAR | Status: AC
  Filled 2018-01-28: qty 5

## 2018-01-28 MED ORDER — EPHEDRINE SULFATE 50 MG/ML IJ SOLN
INTRAMUSCULAR | Status: DC | PRN
  Administered 2018-01-28 (×2): 5 mg via INTRAVENOUS

## 2018-01-28 MED ORDER — ESTRADIOL 0.1 MG/GM VA CREA
TOPICAL_CREAM | VAGINAL | Status: AC
  Filled 2018-01-28: qty 42.5

## 2018-01-28 MED ORDER — ONDANSETRON HCL 4 MG/2ML IJ SOLN
INTRAMUSCULAR | Status: AC
  Filled 2018-01-28: qty 2

## 2018-01-28 MED ORDER — GLYCOPYRROLATE 0.2 MG/ML IJ SOLN
INTRAMUSCULAR | Status: DC | PRN
  Administered 2018-01-28: 0.2 mg via INTRAVENOUS

## 2018-01-28 MED ORDER — LIDOCAINE HCL (CARDIAC) PF 100 MG/5ML IV SOSY
PREFILLED_SYRINGE | INTRAVENOUS | Status: AC
  Filled 2018-01-28: qty 5

## 2018-01-28 MED ORDER — DEXAMETHASONE SODIUM PHOSPHATE 10 MG/ML IJ SOLN
INTRAMUSCULAR | Status: AC
  Filled 2018-01-28: qty 1

## 2018-01-28 MED ORDER — LIDOCAINE HCL (CARDIAC) PF 100 MG/5ML IV SOSY
PREFILLED_SYRINGE | INTRAVENOUS | Status: DC | PRN
  Administered 2018-01-28: 100 mg via INTRAVENOUS

## 2018-01-28 MED ORDER — KETOROLAC TROMETHAMINE 30 MG/ML IJ SOLN
INTRAMUSCULAR | Status: AC
  Filled 2018-01-28: qty 1

## 2018-01-28 MED ORDER — CHLORTHALIDONE 25 MG PO TABS
25.0000 mg | ORAL_TABLET | Freq: Every day | ORAL | Status: DC
  Administered 2018-01-29: 25 mg via ORAL
  Filled 2018-01-28 (×2): qty 1

## 2018-01-28 MED ORDER — PAROXETINE HCL 10 MG PO TABS
10.0000 mg | ORAL_TABLET | Freq: Every day | ORAL | Status: DC
  Administered 2018-01-28: 10 mg via ORAL
  Filled 2018-01-28 (×2): qty 1

## 2018-01-28 MED ORDER — ONDANSETRON HCL 4 MG/2ML IJ SOLN: 4.0000 mg | Freq: Once | INTRAMUSCULAR | Status: DC | PRN

## 2018-01-28 MED ORDER — KETOROLAC TROMETHAMINE 30 MG/ML IJ SOLN
INTRAMUSCULAR | Status: DC | PRN
  Administered 2018-01-28: 30 mg via INTRAVENOUS

## 2018-01-28 MED ORDER — MEPERIDINE HCL 25 MG/ML IJ SOLN: 6.2500 mg | INTRAMUSCULAR | Status: DC | PRN

## 2018-01-28 MED ORDER — OXYCODONE-ACETAMINOPHEN 5-325 MG PO TABS: 1.0000 | ORAL_TABLET | Freq: Four times a day (QID) | ORAL | Status: DC | PRN

## 2018-01-28 MED ORDER — ACETAMINOPHEN 160 MG/5ML PO SOLN: 325.0000 mg | ORAL | Status: DC | PRN

## 2018-01-28 MED ORDER — HYDROMORPHONE 1 MG/ML IV SOLN
INTRAVENOUS | Status: DC
  Administered 2018-01-28: 13:00:00 via INTRAVENOUS
  Administered 2018-01-28: 0 mg via INTRAVENOUS
  Administered 2018-01-29: 0.2 mg via INTRAVENOUS
  Filled 2018-01-28: qty 25

## 2018-01-28 MED ORDER — LACTATED RINGERS IV SOLN
INTRAVENOUS | Status: DC
  Administered 2018-01-28: 125 mL/h via INTRAVENOUS
  Administered 2018-01-28: 11:00:00 via INTRAVENOUS

## 2018-01-28 MED ORDER — SCOPOLAMINE 1 MG/3DAYS TD PT72
MEDICATED_PATCH | TRANSDERMAL | Status: AC
  Filled 2018-01-28: qty 1

## 2018-01-28 MED ORDER — MIDAZOLAM HCL 2 MG/2ML IJ SOLN
INTRAMUSCULAR | Status: AC
  Filled 2018-01-28: qty 2

## 2018-01-28 MED ORDER — STERILE WATER FOR IRRIGATION IR SOLN
Status: DC | PRN
  Administered 2018-01-28: 1000 mL

## 2018-01-28 MED ORDER — ONDANSETRON HCL 4 MG/2ML IJ SOLN
INTRAMUSCULAR | Status: DC | PRN
  Administered 2018-01-28: 4 mg via INTRAVENOUS

## 2018-01-28 MED ORDER — PROPOFOL 10 MG/ML IV BOLUS
INTRAVENOUS | Status: DC | PRN
  Administered 2018-01-28: 200 mg via INTRAVENOUS

## 2018-01-28 MED ORDER — FENTANYL CITRATE (PF) 100 MCG/2ML IJ SOLN
INTRAMUSCULAR | Status: DC | PRN
  Administered 2018-01-28: 100 ug via INTRAVENOUS

## 2018-01-28 MED ORDER — ONDANSETRON HCL 4 MG PO TABS: 4.0000 mg | ORAL_TABLET | Freq: Three times a day (TID) | ORAL | Status: DC | PRN

## 2018-01-28 MED ORDER — SODIUM CHLORIDE 0.9% FLUSH: 9.0000 mL | INTRAVENOUS | Status: DC | PRN

## 2018-01-28 MED ORDER — EPHEDRINE 5 MG/ML INJ
INTRAVENOUS | Status: AC
  Filled 2018-01-28: qty 10

## 2018-01-28 MED ORDER — MENTHOL 3 MG MT LOZG: 1.0000 | LOZENGE | OROMUCOSAL | Status: DC | PRN

## 2018-01-28 MED ORDER — SODIUM CHLORIDE 0.9 % IJ SOLN
INTRAMUSCULAR | Status: AC
  Filled 2018-01-28: qty 50

## 2018-01-28 MED ORDER — IBUPROFEN 600 MG PO TABS: 600.0000 mg | ORAL_TABLET | Freq: Four times a day (QID) | ORAL | Status: DC | PRN

## 2018-01-28 MED ORDER — ROSUVASTATIN CALCIUM 5 MG PO TABS
5.0000 mg | ORAL_TABLET | Freq: Every day | ORAL | Status: DC
  Administered 2018-01-28: 5 mg via ORAL
  Filled 2018-01-28 (×2): qty 1

## 2018-01-28 MED ORDER — VASOPRESSIN 20 UNIT/ML IV SOLN
INTRAVENOUS | Status: DC | PRN
  Administered 2018-01-28: 20 mL via INTRAMUSCULAR

## 2018-01-28 MED ORDER — ACETAMINOPHEN 325 MG PO TABS: 325.0000 mg | ORAL_TABLET | ORAL | Status: DC | PRN

## 2018-01-28 MED ORDER — FENTANYL CITRATE (PF) 100 MCG/2ML IJ SOLN: 25.0000 ug | INTRAMUSCULAR | Status: DC | PRN

## 2018-01-28 MED ORDER — DIPHENHYDRAMINE HCL 12.5 MG/5ML PO ELIX: 12.5000 mg | ORAL_SOLUTION | Freq: Four times a day (QID) | ORAL | Status: DC | PRN

## 2018-01-28 MED ORDER — LACTATED RINGERS IV SOLN
INTRAVENOUS | Status: DC
  Administered 2018-01-28 – 2018-01-29 (×2): via INTRAVENOUS

## 2018-01-28 MED ORDER — CEFAZOLIN SODIUM-DEXTROSE 2-4 GM/100ML-% IV SOLN
INTRAVENOUS | Status: AC
  Filled 2018-01-28: qty 100

## 2018-01-28 MED ORDER — MIDAZOLAM HCL 2 MG/2ML IJ SOLN
INTRAMUSCULAR | Status: DC | PRN
  Administered 2018-01-28: 2 mg via INTRAVENOUS

## 2018-01-28 MED ORDER — OXYCODONE HCL 5 MG PO TABS: 5.0000 mg | ORAL_TABLET | Freq: Once | ORAL | Status: DC | PRN

## 2018-01-28 MED ORDER — POTASSIUM CHLORIDE CRYS ER 20 MEQ PO TBCR
20.0000 meq | EXTENDED_RELEASE_TABLET | Freq: Every day | ORAL | Status: DC
  Administered 2018-01-29: 20 meq via ORAL
  Filled 2018-01-28 (×2): qty 1

## 2018-01-28 MED ORDER — SCOPOLAMINE 1 MG/3DAYS TD PT72
1.0000 | MEDICATED_PATCH | Freq: Once | TRANSDERMAL | Status: DC
  Administered 2018-01-28: 1.5 mg via TRANSDERMAL

## 2018-01-28 MED ORDER — ESTRADIOL 0.1 MG/GM VA CREA
TOPICAL_CREAM | VAGINAL | Status: DC | PRN
  Administered 2018-01-28: 1 via VAGINAL

## 2018-01-28 MED ORDER — KETOROLAC TROMETHAMINE 30 MG/ML IJ SOLN
30.0000 mg | Freq: Four times a day (QID) | INTRAMUSCULAR | Status: AC
  Administered 2018-01-28 – 2018-01-29 (×3): 30 mg via INTRAVENOUS
  Filled 2018-01-28 (×3): qty 1

## 2018-01-28 MED ORDER — ATENOLOL 50 MG PO TABS
50.0000 mg | ORAL_TABLET | Freq: Every day | ORAL | Status: DC
  Administered 2018-01-29: 50 mg via ORAL
  Filled 2018-01-28 (×2): qty 1

## 2018-01-28 MED ORDER — CEFAZOLIN SODIUM-DEXTROSE 2-4 GM/100ML-% IV SOLN
2.0000 g | INTRAVENOUS | Status: AC
  Administered 2018-01-28: 2 g via INTRAVENOUS

## 2018-01-28 SURGICAL SUPPLY — 31 items
BLADE SURG 11 STRL SS (BLADE) ×3 IMPLANT
BLADE SURG 15 STRL LF C SS BP (BLADE) ×1 IMPLANT
BLADE SURG 15 STRL SS (BLADE) ×2
CANISTER SUCT 3000ML PPV (MISCELLANEOUS) ×3 IMPLANT
CATH FOLEY 2WAY SLVR  5CC 18FR (CATHETERS) ×2
CATH FOLEY 2WAY SLVR 5CC 18FR (CATHETERS) ×1 IMPLANT
DECANTER SPIKE VIAL GLASS SM (MISCELLANEOUS) ×3 IMPLANT
DERMABOND ADVANCED (GAUZE/BANDAGES/DRESSINGS) ×2
DERMABOND ADVANCED .7 DNX12 (GAUZE/BANDAGES/DRESSINGS) ×1 IMPLANT
GAUZE PACKING 1 X5 YD ST (GAUZE/BANDAGES/DRESSINGS) ×3 IMPLANT
GAUZE PACKING 2X5 YD STRL (GAUZE/BANDAGES/DRESSINGS) ×3 IMPLANT
GLOVE BIO SURGEON STRL SZ7.5 (GLOVE) ×3 IMPLANT
GLOVE BIOGEL PI IND STRL 7.0 (GLOVE) ×1 IMPLANT
GLOVE BIOGEL PI IND STRL 7.5 (GLOVE) ×1 IMPLANT
GLOVE BIOGEL PI INDICATOR 7.0 (GLOVE) ×2
GLOVE BIOGEL PI INDICATOR 7.5 (GLOVE) ×2
GOWN STRL REUS W/TWL LRG LVL3 (GOWN DISPOSABLE) ×6 IMPLANT
NEEDLE HYPO 22GX1.5 SAFETY (NEEDLE) ×3 IMPLANT
NS IRRIG 1000ML POUR BTL (IV SOLUTION) ×3 IMPLANT
PACK VAGINAL WOMENS (CUSTOM PROCEDURE TRAY) ×3 IMPLANT
SET CYSTO W/LG BORE CLAMP LF (SET/KITS/TRAYS/PACK) ×3 IMPLANT
SLING TRANS VAGINAL TAPE (Sling) ×2 IMPLANT
SLING UTERINE/ABD GYNECARE TVT (Sling) ×1 IMPLANT
SUT MNCRL AB 3-0 PS2 27 (SUTURE) IMPLANT
SUT MNCRL AB 4-0 PS2 18 (SUTURE) IMPLANT
SUT VIC AB 0 CT1 27 (SUTURE) ×2
SUT VIC AB 0 CT1 27XBRD ANBCTR (SUTURE) ×1 IMPLANT
SUT VIC AB 2-0 SH 27 (SUTURE) ×16
SUT VIC AB 2-0 SH 27XBRD (SUTURE) ×8 IMPLANT
TOWEL OR 17X24 6PK STRL BLUE (TOWEL DISPOSABLE) ×6 IMPLANT
TRAY FOLEY W/BAG SLVR 14FR (SET/KITS/TRAYS/PACK) ×3 IMPLANT

## 2018-01-28 NOTE — Op Note (Signed)
Preop Diagnosis: Mixed Urinary Incontinence  Postop Diagnosis: Mixed Urinary Incontinence  Procedure:1.TVT 2. Cystoscopy  Fluids: 1000 cc  UOP: 100 cc  EBL: 150 cc  Complications:none  Procedure:The patient was taken to the operating room after the risks, benefits and alternatives were discussed with patient, the patient verbalized understanding and consent signed and witnessed. The patient was placed under general anesthesia and prepped and draped in the normal sterile fashion in the dorsal lithotomy position.  A TIME OUT was performed and the pt identified as Alexandra Clark and procedure identified as TVT and Cystoscopy.  A weighted speculum was placed in the patient's vagina and the anterior vaginal wall was injected with dilute pitressin at a concentration of 20 units of pitressin in a total of 100cc of normal saline.  An incision was made in the anterior wall of the vagina for approximately 1cm beneath the midurethra and the underlying tissue was dissected away from the anterior vaginal wall down to the level of the lower symphysis pubis bilaterally. Attention was then turned to the mons pubis where two 5 mm incisions were made 2 fingerbreadths from the midline. The transabdominal guide was then passed through the mons pubis incision on the patient's right down through the space of Retzius and out through the anterior vaginal wall after deflecting the rigid urethral catheter guide to the ipsilateral side. The same was done on the contralateral side. Cystoscopy was performed and no invadvertant bladder injury was noted. The bladder was drained with a Foley while deflecting the rigid urethral catheter guide to the patient's right and the mesh was attached to the transabdominal guide and elevated up through the space of Retzius and out through the incision on the mons pubis on the ipsilateral side. The same was done on the contralateral side. Cystoscopy was performed again and no inadvertant bladder  injury was noted. The 6618 French Foley was left in the urethra and a large Tresa EndoKelly was placed between the urethra and the mesh in order to leave the mesh slack beneath the midurethra. The mesh was then cut flush with the skin at the mons pubis incisions bilaterally. Cystoscopy was performed again and bilateral ureters were noted to efflux without difficulty. The bilateral incisions on the mons pubis were then cleaned and Dermabond applied. The anterior vaginal wall incision was repaired with 2-0 vicryl via a running interlocking stitch.  Vagina was packed with estrogen soaked vaginal packing.  Sponge, lap and needle count was correct.  The patient tolerated the procedure well and was returned to the recovery room in good condition.

## 2018-01-28 NOTE — Interval H&P Note (Signed)
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 After consideration of risks, benefits and other options for treatment, the patient has consented to  Procedure(s): TRANSVAGINAL TAPE/TENSION-FREE VAGINAL TAPE (TVT) PROCEDURE (Bilateral) and CYSTOSCOPY as a surgical intervention .  The patient's history has been reviewed, patient examined, no change in status, stable for surgery.  I have reviewed the patient's chart and labs.  Questions were answered to the patient's satisfaction.     Purcell NailsAngela Y Tina Temme

## 2018-01-28 NOTE — Anesthesia Preprocedure Evaluation (Signed)
Anesthesia Evaluation  Patient identified by MRN, date of birth, ID band Patient awake    Reviewed: Allergy & Precautions, H&P , NPO status , Patient's Chart, lab work & pertinent test results, reviewed documented beta blocker date and time   History of Anesthesia Complications (+) PONV and history of anesthetic complications  Airway Mallampati: II  TM Distance: >3 FB Neck ROM: full    Dental no notable dental hx.    Pulmonary sleep apnea and Continuous Positive Airway Pressure Ventilation ,    Pulmonary exam normal breath sounds clear to auscultation       Cardiovascular Exercise Tolerance: Good hypertension, Pt. on medications and Pt. on home beta blockers + Past MI   Rhythm:regular Rate:Normal  ECHO 8/18 Study Conclusions  - Left ventricle: The cavity size was normal. Systolic function was   vigorous. The estimated ejection fraction was in the range of 65%   to 70%. Wall motion was normal; there were no regional wall   motion abnormalities. Left ventricular diastolic function   parameters were normal.   Neuro/Psych PSYCHIATRIC DISORDERS Depression  Neuromuscular disease    GI/Hepatic negative GI ROS, Neg liver ROS,   Endo/Other  negative endocrine ROS  Renal/GU negative Renal ROS  negative genitourinary   Musculoskeletal  (+) Arthritis , Osteoarthritis,    Abdominal   Peds  Hematology negative hematology ROS (+)   Anesthesia Other Findings   Reproductive/Obstetrics negative OB ROS                            Anesthesia Physical Anesthesia Plan  ASA: II  Anesthesia Plan: General   Post-op Pain Management:    Induction: Intravenous  PONV Risk Score and Plan: 3 and Ondansetron, Dexamethasone, Treatment may vary due to age or medical condition, Scopolamine patch - Pre-op and Midazolam  Airway Management Planned: Oral ETT and LMA  Additional Equipment:   Intra-op Plan:    Post-operative Plan: Extubation in OR  Informed Consent: I have reviewed the patients History and Physical, chart, labs and discussed the procedure including the risks, benefits and alternatives for the proposed anesthesia with the patient or authorized representative who has indicated his/her understanding and acceptance.   Dental Advisory Given  Plan Discussed with: CRNA, Anesthesiologist and Surgeon  Anesthesia Plan Comments: (  )      Anesthesia Quick Evaluation

## 2018-01-28 NOTE — Transfer of Care (Signed)
Immediate Anesthesia Transfer of Care Note  Patient: Alexandra ShutterJanice S Massett  Procedure(s) Performed: TRANSVAGINAL TAPE (TVT) PROCEDURE (Bilateral Pelvis)  Patient Location: PACU  Anesthesia Type:General  Level of Consciousness: awake, alert  and oriented  Airway & Oxygen Therapy: Patient Spontanous Breathing and Patient connected to nasal cannula oxygen  Post-op Assessment: Report given to RN and Post -op Vital signs reviewed and stable  Post vital signs: Reviewed and stable  Last Vitals:  Vitals Value Taken Time  BP    Temp    Pulse    Resp    SpO2      Last Pain:  Vitals:   01/28/18 0857  TempSrc: Oral  PainSc: 0-No pain      Patients Stated Pain Goal: 3 (01/28/18 0857)  Complications: No apparent anesthesia complications

## 2018-01-28 NOTE — Anesthesia Postprocedure Evaluation (Signed)
Anesthesia Post Note  Patient: Alexandra ShutterJanice S Clark  Procedure(s) Performed: TRANSVAGINAL TAPE (TVT) PROCEDURE (Bilateral Pelvis)     Patient location during evaluation: PACU Anesthesia Type: General Level of consciousness: awake and alert Pain management: pain level controlled Vital Signs Assessment: post-procedure vital signs reviewed and stable Respiratory status: spontaneous breathing, nonlabored ventilation, respiratory function stable and patient connected to nasal cannula oxygen Cardiovascular status: blood pressure returned to baseline and stable Postop Assessment: no apparent nausea or vomiting Anesthetic complications: no    Last Vitals:  Vitals:   01/28/18 1213 01/28/18 1230  BP:  114/60  Pulse:  64  Resp:  16  Temp:  36.7 C  SpO2: 100% 94%    Last Pain:  Vitals:   01/28/18 1230  TempSrc: Oral  PainSc: 0-No pain   Pain Goal: Patients Stated Pain Goal: 3 (01/28/18 0857)               Heena Woodbury

## 2018-01-28 NOTE — Discharge Instructions (Signed)
Call [facility] @ [phone] if: ° °You have a temperature greater than or equal to 100.4 degrees Farenheit orally °You have pain that is not made better by the pain medication given and taken as directed °You have excessive bleeding or problems urinating ° °Take Colace (Docusate Sodium/Stool Softener) 100 mg 2-3 times daily while taking narcotic pain medicine to avoid constipation or until bowel movements are regular. °Take Ibuprofen 600 mg with food every 6 hours for 5 days then as needed for pain ° °You may drive after 1 week °You may walk up steps ° °You may shower  °You may resume a regular diet ° °Keep incisions clean and dry °Do not lift over 15 pounds for 6 weeks °Avoid anything in vagina for 6 weeks (or until after your post-operative visit) ° °

## 2018-01-28 NOTE — Anesthesia Procedure Notes (Signed)
Procedure Name: LMA Insertion Date/Time: 01/28/2018 10:05 AM Performed by: Armanda HeritageSterling, Dovie Kapusta M, CRNA Pre-anesthesia Checklist: Patient identified, Emergency Drugs available, Suction available, Patient being monitored and Timeout performed Patient Re-evaluated:Patient Re-evaluated prior to induction Oxygen Delivery Method: Circle system utilized Preoxygenation: Pre-oxygenation with 100% oxygen Induction Type: IV induction LMA Size: 4.0 Number of attempts: 1 Placement Confirmation: ETT inserted through vocal cords under direct vision ETT to lip (cm): at lips. Dental Injury: Teeth and Oropharynx as per pre-operative assessment

## 2018-01-29 DIAGNOSIS — N3946 Mixed incontinence: Secondary | ICD-10-CM | POA: Diagnosis not present

## 2018-01-29 LAB — BASIC METABOLIC PANEL
ANION GAP: 9 (ref 5–15)
BUN: 11 mg/dL (ref 6–20)
CALCIUM: 9 mg/dL (ref 8.9–10.3)
CO2: 28 mmol/L (ref 22–32)
Chloride: 102 mmol/L (ref 101–111)
Creatinine, Ser: 0.79 mg/dL (ref 0.44–1.00)
GFR calc Af Amer: >60 mL/min (ref >60)
Glucose, Bld: 136 mg/dL — ABNORMAL HIGH (ref 65–99)
POTASSIUM: 3.7 mmol/L (ref 3.5–5.1)
Sodium: 139 mmol/L (ref 135–145)

## 2018-01-29 LAB — CBC
HEMATOCRIT: 36.3 % (ref 36.0–46.0)
Hemoglobin: 12 g/dL (ref 12.0–15.0)
MCH: 30.6 pg (ref 26.0–34.0)
MCHC: 33.1 g/dL (ref 30.0–36.0)
MCV: 92.6 fL (ref 78.0–100.0)
PLATELETS: 192 10*3/uL (ref 150–400)
RBC: 3.92 MIL/uL (ref 3.87–5.11)
RDW: 13 % (ref 11.5–15.5)
WBC: 16.3 10*3/uL — AB (ref 4.0–10.5)

## 2018-01-29 MED ORDER — IBUPROFEN 600 MG PO TABS: ORAL_TABLET | ORAL | 1 refills | Status: AC

## 2018-01-29 MED ORDER — HYDROCODONE-ACETAMINOPHEN 5-325 MG PO TABS: 1.0000 | ORAL_TABLET | Freq: Four times a day (QID) | ORAL | 0 refills | Status: AC | PRN

## 2018-01-29 MED ORDER — OXYCODONE-ACETAMINOPHEN 5-325 MG PO TABS: ORAL_TABLET | ORAL | 0 refills | Status: DC

## 2018-01-29 MED ORDER — CEPHALEXIN 500 MG PO CAPS: ORAL_CAPSULE | ORAL | 0 refills | Status: AC

## 2018-01-29 NOTE — Progress Notes (Signed)
Pt given discharge instructions, reviewed medications and understands postop care and follow-up. Postop brochure given.

## 2018-01-29 NOTE — Progress Notes (Addendum)
Alexandra ShutterJanice S Clark [age] y.o.  [id number]  Post Op Date # 1: TVT/Cystoscopy  Subjective: Patient is Doing well postoperatively. Patient has Pain is controlled with current analgesics. Medications being used: prescription NSAID's including Ketorolac 30 mg IV and narcotic analgesics including PCA Dilaudid (low dose). The patient has ambulated in the halls, tolerated a regular diet, passed flatus but hasn't voided yet.   Objective: Vital signs in last 24 hours: Temp:  [97.7 F (36.5 C)-98.6 F (37 C)] 97.8 F (36.6 C) (06/06 0552) Pulse Rate:  [59-82] 61 (06/06 0552) Resp:  [16-22] 16 (06/06 0552) BP: (94-137)/(51-73) 112/61 (06/06 0552) SpO2:  [94 %-100 %] 97 % (06/06 0600) Weight:  [225 lb (102.1 kg)] 225 lb (102.1 kg) (06/05 1247)  Intake/Output from previous day: 06/05 0701 - 06/06 0700 In: 1800 [P.O.:600; I.V.:1200] Out: 3225 [Urine:3075] Intake/Output this shift: No intake/output data recorded. Recent Labs  Lab 01/29/18 0528  WBC 16.3*  HGB 12.0  HCT 36.3  PLT 192     Recent Labs  Lab 01/28/18 0844 01/29/18 0528  NA 140 139  K 3.9 3.7  CL 102 102  CO2 26 28  BUN 14 11  CREATININE 0.85 0.79  CALCIUM 9.6 9.0  GLUCOSE 104* 136*    EXAM: General: alert, cooperative and no distress Resp: clear to auscultation bilaterally Cardio: regular rate and rhythm, S1, S2 normal, no murmur, click, rub or gallop GI: soft, non-tender; bowel sounds normal; no masses,  no organomegaly Extremities: Homans sign is negative, no sign of DVT and no calf tenderness Vaginal Bleeding: Faint dry pink stain on perineal pad throughout.   Assessment: s/p Procedure(s): TRANSVAGINAL TAPE (TVT) PROCEDURE: stable, progressing well and tolerating diet  Plan: Discharge home Awaiting patient to void.  LOS: 0 days    Alexandra Leberlmira Powell, PA-C 01/29/2018 8:09 AM  Doing well.  Voiding without difficulty and feels like she is emptying.  Ambulating without difficulty.  Tolerating po. D/C  instructions reviewed.  Minimal VB.  Pt reports nausea to oxycodone she thinks.  Will rx vicodin instead along with ibuprofen.

## 2018-01-29 NOTE — Discharge Summary (Addendum)
Physician Discharge Summary  Patient ID: Alexandra ShutterJanice S Clark MRN: [id number] DOB/AGE: [birth year] 57 y.o.  Admit date: 01/28/2018 Discharge date: 01/29/2018   Discharge Diagnoses: Mixed Urinary Incontinence Active Problems:   Incontinence of urine in female   Operation: Placement of Tension Free Vaginal Tape and Cystoscopy   Discharged Condition: Good  Hospital Course: On the date of admission the patient underwent the aforementioned procedures and tolerated them well.  Post operative course was unremarkable with the patient resuming bowel and bladder function by post operative day #1 and was therefore deemed ready for discharge home.  Discharge hemoglobin was 12.0.  Disposition:   Discharge Medications:  Allergies as of 01/29/2018   No Known Allergies     Medication List    TAKE these medications   atenolol-chlorthalidone 50-25 MG tablet Commonly known as:  TENORETIC Take 1 tablet by mouth daily.   CoQ10 50 MG Caps Take 1 capsule by mouth daily.   Fish Oil 1200 MG Caps Take 1 capsule by mouth daily.   furosemide 40 MG tablet Commonly known as:  LASIX Take 40 mg by mouth daily as needed for fluid. Takes in course of three days as needed for fluid retention   ibuprofen 600 MG tablet Commonly known as:  ADVIL,MOTRIN 1 po pc every 6 hours for 5 days then as needed for pain   loratadine 10 MG tablet Commonly known as:  CLARITIN Take 10 mg by mouth at bedtime.   Vicodin 5-325mg  tablet Hydrocodone-acetaminophen Commonly known as: Vicodin 1  po every 6 hours as needed for breakthrough post operative pain   PARoxetine 10 MG tablet Commonly known as:  PAXIL Take 10 mg by mouth at bedtime.   potassium chloride 10 MEQ tablet Commonly known as:  K-DUR,KLOR-CON Take 10 mEq by mouth daily. Must take additional tablet if taking lasix   rosuvastatin 5 MG tablet Commonly known as:  CRESTOR Take 5 mg by mouth at bedtime.       Cephalexin 500 mg 1 po bid x 3  days   Follow-up: Dr. Woodroe ModeAngela Y. Su Hiltoberts on March 11, 2018 at 10:15 a.m.   Signed: Henreitta LeberElmira Powell, PA-C 01/29/2018, 8:15 AM

## 2018-02-09 ENCOUNTER — Encounter (HOSPITAL_COMMUNITY): Payer: Self-pay | Admitting: Obstetrics and Gynecology

## 2018-03-28 ENCOUNTER — Encounter (HOSPITAL_COMMUNITY): Payer: Self-pay | Admitting: Emergency Medicine

## 2018-03-28 ENCOUNTER — Emergency Department (HOSPITAL_COMMUNITY)
Admission: EM | Admit: 2018-03-28 | Discharge: 2018-03-28 | Disposition: A | Discharge status: Home / Self Care | Payer: BLUE CROSS/BLUE SHIELD | Attending: Emergency Medicine | Admitting: Emergency Medicine

## 2018-03-28 ENCOUNTER — Other Ambulatory Visit: Payer: Self-pay

## 2018-03-28 DIAGNOSIS — R197 Diarrhea, unspecified: Secondary | ICD-10-CM | POA: Diagnosis not present

## 2018-03-28 DIAGNOSIS — R1084 Generalized abdominal pain: Secondary | ICD-10-CM | POA: Diagnosis not present

## 2018-03-28 DIAGNOSIS — Z79899 Other long term (current) drug therapy: Secondary | ICD-10-CM | POA: Diagnosis not present

## 2018-03-28 DIAGNOSIS — I252 Old myocardial infarction: Secondary | ICD-10-CM | POA: Diagnosis not present

## 2018-03-28 DIAGNOSIS — I10 Essential (primary) hypertension: Secondary | ICD-10-CM | POA: Diagnosis not present

## 2018-03-28 DIAGNOSIS — R112 Nausea with vomiting, unspecified: Secondary | ICD-10-CM

## 2018-03-28 DIAGNOSIS — E876 Hypokalemia: Secondary | ICD-10-CM

## 2018-03-28 DIAGNOSIS — R61 Generalized hyperhidrosis: Secondary | ICD-10-CM | POA: Diagnosis not present

## 2018-03-28 HISTORY — DX: Candidiasis, unspecified: B37.9

## 2018-03-28 LAB — COMPREHENSIVE METABOLIC PANEL
ALK PHOS: 52 U/L (ref 38–126)
ALT: 23 U/L (ref 0–44)
ANION GAP: 11 (ref 5–15)
AST: 31 U/L (ref 15–41)
Albumin: 4.1 g/dL (ref 3.5–5.0)
BUN: 15 mg/dL (ref 6–20)
CALCIUM: 9.3 mg/dL (ref 8.9–10.3)
CO2: 27 mmol/L (ref 22–32)
Chloride: 100 mmol/L (ref 98–111)
Creatinine, Ser: 0.84 mg/dL (ref 0.44–1.00)
GFR calc Af Amer: >60 mL/min (ref >60)
GLUCOSE: 145 mg/dL — AB (ref 70–99)
Potassium: 2.6 mmol/L — CL (ref 3.5–5.1)
Sodium: 138 mmol/L (ref 135–145)
TOTAL PROTEIN: 7.9 g/dL (ref 6.5–8.1)
Total Bilirubin: 1 mg/dL (ref 0.3–1.2)

## 2018-03-28 LAB — CBC
HCT: 43.1 % (ref 36.0–46.0)
HEMOGLOBIN: 14.2 g/dL (ref 12.0–15.0)
MCH: 30.7 pg (ref 26.0–34.0)
MCHC: 32.9 g/dL (ref 30.0–36.0)
MCV: 93.1 fL (ref 78.0–100.0)
Platelets: 244 10*3/uL (ref 150–400)
RBC: 4.63 MIL/uL (ref 3.87–5.11)
RDW: 12.9 % (ref 11.5–15.5)
WBC: 9.1 10*3/uL (ref 4.0–10.5)

## 2018-03-28 LAB — URINALYSIS, ROUTINE W REFLEX MICROSCOPIC
BILIRUBIN URINE: NEGATIVE
Glucose, UA: NEGATIVE mg/dL
Hgb urine dipstick: NEGATIVE
KETONES UR: 5 mg/dL — AB
Leukocytes, UA: NEGATIVE
NITRITE: NEGATIVE
Protein, ur: NEGATIVE mg/dL
Specific Gravity, Urine: 1.02 (ref 1.005–1.030)
pH: 6 (ref 5.0–8.0)

## 2018-03-28 LAB — LIPASE, BLOOD: Lipase: 32 U/L (ref 11–51)

## 2018-03-28 LAB — TROPONIN I

## 2018-03-28 MED ORDER — SODIUM CHLORIDE 0.9 % IV BOLUS
1000.0000 mL | Freq: Once | INTRAVENOUS | Status: AC
  Administered 2018-03-28: 1000 mL via INTRAVENOUS

## 2018-03-28 MED ORDER — MORPHINE SULFATE (PF) 4 MG/ML IV SOLN
4.0000 mg | Freq: Once | INTRAVENOUS | Status: AC
  Administered 2018-03-28: 4 mg via INTRAVENOUS
  Filled 2018-03-28: qty 1

## 2018-03-28 MED ORDER — ONDANSETRON HCL 4 MG/2ML IJ SOLN
4.0000 mg | Freq: Once | INTRAMUSCULAR | Status: AC
  Administered 2018-03-28: 4 mg via INTRAVENOUS
  Filled 2018-03-28: qty 2

## 2018-03-28 MED ORDER — POTASSIUM CHLORIDE ER 10 MEQ PO TBCR: 10.0000 meq | EXTENDED_RELEASE_TABLET | Freq: Every day | ORAL | 0 refills | Status: DC

## 2018-03-28 MED ORDER — ONDANSETRON HCL 4 MG PO TABS: 4.0000 mg | ORAL_TABLET | Freq: Four times a day (QID) | ORAL | 0 refills | Status: DC

## 2018-03-28 MED ORDER — POTASSIUM CHLORIDE 10 MEQ/100ML IV SOLN
10.0000 meq | INTRAVENOUS | Status: AC
  Administered 2018-03-28 (×3): 10 meq via INTRAVENOUS
  Filled 2018-03-28 (×3): qty 100

## 2018-03-28 NOTE — ED Notes (Signed)
Date and time results received: 03/28/18 7:28 PM  Test: Potassium Critical Value: 2.6  Name of Provider Notified: Dr. Charm BargesButler  Orders Received? Or Actions Taken?: None at this time

## 2018-03-28 NOTE — Discharge Instructions (Addendum)
You were seen in the emergency department for nausea vomiting and diarrhea.  This is likely gastroenteritis.  We checked some blood work and your potassium was low likely due to your diarrhea.  You were improved with some IV fluids and some potassium.  We are sending you home with a prescription for nausea and for potassium supplement.  Please follow-up with your doctor and return if any symptoms.

## 2018-03-28 NOTE — ED Provider Notes (Signed)
[facility] EMERGENCY DEPARTMENT Provider Note   CSN: [id number] Arrival date & time: 03/28/18  1815     History   Chief Complaint Chief Complaint  Patient presents with  . Abdominal Pain    HPI ILLONA Clark is a 57 y.o. female.  She is complaining of acute onset of diarrhea around 3 AM today multiple episodes followed by vomiting later this morning.  She states it was not blood from either end.  She has had crampy abdominal pain cold sweats and feeling of rawness around her rectum from the amount of stooling she is done.  She did not eat anything really unusual yesterday.  No sick contacts.  No recent travel.  She is unable to hold down any p.o. and has not taken her medicines.  The history is provided by the patient.  Abdominal Pain   This is a new problem. The current episode started 12 to 24 hours ago. The problem occurs constantly. The problem has not changed since onset.The pain is located in the generalized abdominal region. The quality of the pain is cramping. The pain is moderate. Associated symptoms include diarrhea, nausea and vomiting. Pertinent negatives include fever, dysuria, frequency and headaches. The symptoms are aggravated by vomiting. Nothing relieves the symptoms.    Past Medical History:  Diagnosis Date  . Arthritis    feet  . DDD (degenerative disc disease), cervical   . Depression   . HSV infection   . Hyperlipidemia   . Hypertension   . OSA on CPAP    uses CPAP nightly  . PONV (postoperative nausea and vomiting)   . Pre-diabetes    diet controlled  . Seasonal allergies   . SVD (spontaneous vaginal delivery)    x 1  . Vitamin D deficiency   . Yeast infection     Patient Active Problem List   Diagnosis Date Noted  . Incontinence of urine in female 01/28/2018  . Pre-diabetes   . OSA on CPAP   . Vitamin D deficiency   . Carpal tunnel syndrome   . NSTEMI (non-ST elevated myocardial infarction) (HCC)   . Essential hypertension   . Pure  hypercholesterolemia   . Other chest pain     Past Surgical History:  Procedure Laterality Date  . ABDOMINAL HYSTERECTOMY    . BLADDER SUSPENSION Bilateral 01/28/2018   Procedure: TRANSVAGINAL TAPE (TVT) PROCEDURE;  Surgeon: Osborn Coho, MD;  Location: [facility];  Service: Gynecology;  Laterality: Bilateral;  . CARPAL TUNNEL RELEASE Bilateral   . COLONOSCOPY    . DILATION AND CURETTAGE OF UTERUS    . KNEE ARTHROSCOPY WITH ANTERIOR CRUCIATE LIGAMENT (ACL) REPAIR Right   . LEFT HEART CATH AND CORONARY ANGIOGRAPHY N/A 04/02/2017   Procedure: LEFT HEART CATH AND CORONARY ANGIOGRAPHY;  Surgeon: Iran Ouch, MD;  Location: [facility] INVASIVE CV LAB;  Service: Cardiovascular;  Laterality: N/A;  . TONSILLECTOMY    . UPPER GI ENDOSCOPY    . WISDOM TOOTH EXTRACTION       OB History   None      Home Medications    Prior to Admission medications   Medication Sig Start Date End Date Taking? Authorizing Provider  atenolol-chlorthalidone (TENORETIC) 50-25 MG per tablet Take 1 tablet by mouth daily. 03/03/15  Yes [provider]  Coenzyme Q10 (COQ10) 50 MG CAPS Take 1 capsule by mouth daily.   Yes [provider]  furosemide (LASIX) 40 MG tablet Take 40 mg by mouth daily as needed for  fluid. Takes in course of three days as needed for fluid retention   Yes [provider]  PARoxetine (PAXIL) 10 MG tablet Take 10 mg by mouth at bedtime.  11/10/17  Yes [provider]  potassium chloride (K-DUR,KLOR-CON) 10 MEQ tablet Take 10 mEq by mouth daily. Must take additional tablet if taking lasix   Yes [provider]  rosuvastatin (CRESTOR) 5 MG tablet Take 5 mg by mouth at bedtime.  12/15/17  Yes [provider]  cephALEXin (KEFLEX) 500 MG capsule 1  po  bid x 3 days Patient not taking: Reported on 03/28/2018 01/29/18   Henreitta LeberPowell, Elmira, PA-C  HYDROcodone-acetaminophen (NORCO/VICODIN) 5-325 MG tablet Take 1 tablet by mouth every 6 (six) hours as needed for  moderate pain. Patient not taking: Reported on 03/28/2018 01/29/18   Osborn Cohooberts, Angela, MD  ibuprofen (ADVIL,MOTRIN) 600 MG tablet 1 po pc every 6 hours for 5 days then as needed for pain Patient not taking: Reported on 03/28/2018 01/29/18   Henreitta LeberPowell, Elmira, PA-C    Family History Family History  Problem Relation Age of Onset  . Heart attack Mother 380  . Hypertension Mother   . Hyperlipidemia Mother   . Diabetes Mother   . CVA Father   . Hypertension Father   . Hyperlipidemia Father     Social History Social History   Tobacco Use  . Smoking status: Never Smoker  . Smokeless tobacco: Never Used  Substance Use Topics  . Alcohol use: No  . Drug use: No     Allergies   Patient has no known allergies.   Review of Systems Review of Systems  Constitutional: Negative for fever.  HENT: Negative for sore throat.   Eyes: Negative for visual disturbance.  Respiratory: Negative for shortness of breath.   Cardiovascular: Negative for chest pain.  Gastrointestinal: Positive for abdominal pain, diarrhea, nausea and vomiting.  Genitourinary: Negative for dysuria, frequency and vaginal bleeding.  Musculoskeletal: Negative for back pain and neck pain.  Skin: Negative for rash.  Neurological: Negative for headaches.     Physical Exam Updated Vital Signs BP 134/76 (BP Location: Right Arm)   Pulse 93   Temp 99.6 F (37.6 C) (Oral)   Resp 18   Ht 5' 5.5" (1.664 m)   Wt 100.2 kg (221 lb)   SpO2 98%   BMI 36.22 kg/m   Physical Exam  Constitutional: She appears well-developed and well-nourished. No distress.  HENT:  Head: Normocephalic and atraumatic.  Right Ear: External ear normal.  Left Ear: External ear normal.  Nose: Nose normal.  Mouth/Throat: Oropharynx is clear and moist.  Eyes: Pupils are equal, round, and reactive to light. Conjunctivae and EOM are normal.  Neck: Neck supple.  Cardiovascular: Normal rate and regular rhythm.  No murmur heard. Pulmonary/Chest: Effort  normal and breath sounds normal. No respiratory distress.  Abdominal: Soft. There is generalized tenderness (mild). There is no rigidity, no rebound and no guarding.  Musculoskeletal: She exhibits no edema, tenderness or deformity.  Neurological: She is alert.  Skin: Skin is warm and dry. Capillary refill takes less than 2 seconds.  Psychiatric: She has a normal mood and affect.  Nursing note and vitals reviewed.    ED Treatments / Results  Labs (all labs ordered are listed, but only abnormal results are displayed) Labs Reviewed  COMPREHENSIVE METABOLIC PANEL - Abnormal; Notable for the following components:      Result Value   Potassium 2.6 (*)    Glucose, Bld 145 (*)  All other components within normal limits  URINALYSIS, ROUTINE W REFLEX MICROSCOPIC - Abnormal; Notable for the following components:   Ketones, ur 5 (*)    All other components within normal limits  LIPASE, BLOOD  CBC  TROPONIN I    EKG EKG Interpretation  Date/Time:  Saturday March 28 2018 20:29:08 EDT Ventricular Rate:  85 PR Interval:    QRS Duration: 106 QT Interval:  408 QTC Calculation: 486 R Axis:   46 Text Interpretation:  Sinus rhythm Low voltage, precordial leads Nonspecific T abnormalities, diffuse leads Borderline prolonged QT interval new from prior 8/18 Confirmed by Meridee Score [phone]) on 03/28/2018 8:33:53 PM   Radiology No results found.  Procedures Procedures (including critical care time)  Medications Ordered in ED Medications  sodium chloride 0.9 % bolus 1,000 mL (0 mLs Intravenous Stopped 03/28/18 2107)  ondansetron (ZOFRAN) injection 4 mg (4 mg Intravenous Given 03/28/18 2002)  morphine 4 MG/ML injection 4 mg (4 mg Intravenous Given 03/28/18 2002)  potassium chloride 10 mEq in 100 mL IVPB (0 mEq Intravenous Stopped 03/28/18 2325)  sodium chloride 0.9 % bolus 1,000 mL (0 mLs Intravenous Stopped 03/28/18 2325)     Initial Impression / Assessment and Plan / ED Course  I have  reviewed the triage vital signs and the nursing notes.  Pertinent labs & imaging results that were available during my care of the patient were reviewed by me and considered in my medical decision making (see chart for details).  Clinical Course as of Mar 30 1039  Sat Mar 28, 2018  2242 Feels better and has had no vomiting here.  She is gotten some IV potassium and been taking ice chips.  I asked the nurse to get her something to drink and if she can do that she likely can be discharged home.   [MB]    Clinical Course User Index [MB] Terrilee Files, MD    Final Clinical Impressions(s) / ED Diagnoses   Final diagnoses:  Nausea vomiting and diarrhea  Hypokalemia    ED Discharge Orders        Ordered    ondansetron (ZOFRAN) 4 MG tablet  Every 6 hours     03/28/18 2327    potassium chloride (K-DUR) 10 MEQ tablet  Daily     03/28/18 2327       Terrilee Files, MD 03/29/18 1041

## 2018-03-28 NOTE — ED Notes (Signed)
Water given  

## 2018-03-28 NOTE — ED Triage Notes (Signed)
Patient c/o upper abd pain with nausea, vomiting, and diarrhea. Per patient symptoms started this morning. Denies any blood in stool or urinary symptoms. Unsure of any fevers.

## 2018-10-23 IMAGING — CR DG CHEST 1V PORT
1 series · 1 of 1 positions shown · non-contrast
Comparison: 02/05/2014

CLINICAL DATA: Dyspnea and left-sided chest pain prior to arrival.

EXAM:
PORTABLE CHEST 1 VIEW

[portable]
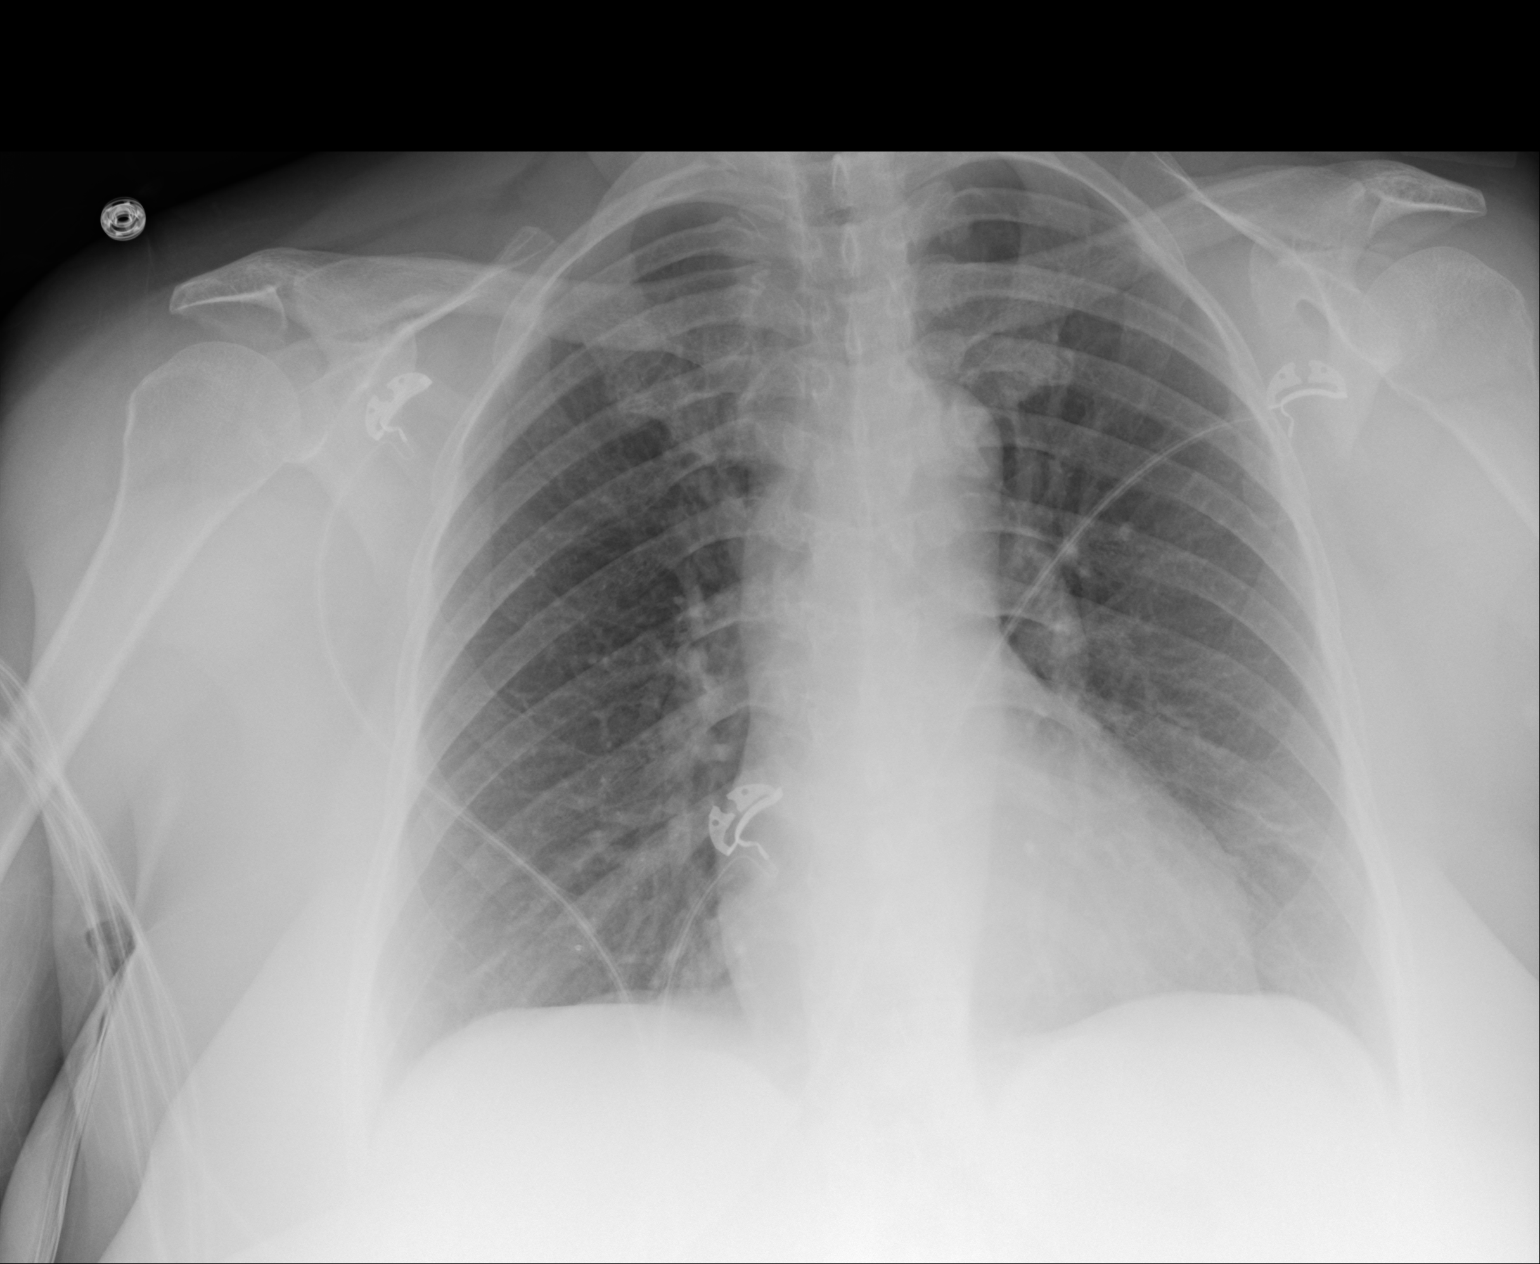

[1 of 1 positions shown; findings below may reference images not displayed]

FINDINGS: The heart size and mediastinal contours are within normal limits.
Both lungs are clear. The visualized skeletal structures are
unremarkable.
IMPRESSION: No active disease.

## 2019-06-08 ENCOUNTER — Other Ambulatory Visit: Payer: Self-pay

## 2019-06-08 DIAGNOSIS — Z20822 Contact with and (suspected) exposure to covid-19: Secondary | ICD-10-CM

## 2019-06-10 LAB — NOVEL CORONAVIRUS, NAA: SARS-CoV-2, NAA: NOT DETECTED

## 2019-12-16 ENCOUNTER — Ambulatory Visit: Payer: BC Managed Care – PPO | Attending: Internal Medicine

## 2019-12-16 DIAGNOSIS — Z23 Encounter for immunization: Secondary | ICD-10-CM

## 2019-12-16 NOTE — Progress Notes (Signed)
   Covid-19 Vaccination Clinic  Name:  Alexandra Clark    MRN: [id number] DOB: [birth year]  12/16/2019  [name] was observed post Covid-19 immunization for 15 minutes without incident. She was provided with Vaccine Information Sheet and instruction to access the V-Safe system.   [name] was instructed to call 911 with any severe reactions post vaccine: Marland Kitchen Difficulty breathing  . Swelling of face and throat  . A fast heartbeat  . A bad rash all over body  . Dizziness and weakness   Immunizations Administered    Name Date Dose VIS Date Route   Moderna COVID-19 Vaccine 12/16/2019 12:05 PM 0.5 mL 07/2019 Intramuscular   Manufacturer: Gala Murdoch   Lot: [id number]   NDC: 85885-027-74      Covid-19 Vaccination Clinic  Name:  Alexandra Clark    MRN: [id number] DOB: [birth year]  12/16/2019  [name] was observed post Covid-19 immunization for 15 minutes without incident. She was provided with Vaccine Information Sheet and instruction to access the V-Safe system.   [name] was instructed to call 911 with any severe reactions post vaccine: Marland Kitchen Difficulty breathing  . Swelling of face and throat  . A fast heartbeat  . A bad rash all over body  . Dizziness and weakness   Immunizations Administered    Name Date Dose VIS Date Route   Moderna COVID-19 Vaccine 12/16/2019 12:05 PM 0.5 mL 07/2019 Intramuscular   Manufacturer: Moderna   Lot: [id number]   NDC: 62836-629-47

## 2020-02-03 ENCOUNTER — Ambulatory Visit: Payer: BC Managed Care – PPO | Attending: Internal Medicine

## 2020-02-03 DIAGNOSIS — Z23 Encounter for immunization: Secondary | ICD-10-CM

## 2020-02-03 NOTE — Progress Notes (Signed)
   Covid-19 Vaccination Clinic  Name:  NELA BASCOM    MRN: [id number] DOB: September 07, 1960  02/03/2020  Ms. Leeth was observed post Covid-19 immunization for 15 minutes without incident. She was provided with Vaccine Information Sheet and instruction to access the V-Safe system.   Ms. Kretz was instructed to call 911 with any severe reactions post vaccine: Marland Kitchen Difficulty breathing  . Swelling of face and throat  . A fast heartbeat  . A bad rash all over body  . Dizziness and weakness   Immunizations Administered    Name Date Dose VIS Date Route   Moderna COVID-19 Vaccine 02/03/2020 11:00 AM 0.5 mL 07/2019 Intramuscular   Manufacturer: Moderna   Lot: [id number]   NDC: 91638-466-59

## 2021-12-24 ENCOUNTER — Telehealth: Payer: Self-pay | Admitting: Cardiology

## 2022-06-11 ENCOUNTER — Other Ambulatory Visit (HOSPITAL_COMMUNITY): Payer: Self-pay | Admitting: Internal Medicine

## 2022-06-11 ENCOUNTER — Other Ambulatory Visit: Payer: Self-pay | Admitting: Internal Medicine

## 2022-06-11 DIAGNOSIS — R1084 Generalized abdominal pain: Secondary | ICD-10-CM

## 2022-06-19 ENCOUNTER — Ambulatory Visit (HOSPITAL_COMMUNITY)
Admission: RE | Admit: 2022-06-19 | Discharge: 2022-06-19 | Disposition: A | Discharge status: Home / Self Care | Payer: BC Managed Care – PPO | Source: Ambulatory Visit | Attending: Internal Medicine | Admitting: Internal Medicine

## 2022-06-19 DIAGNOSIS — R1084 Generalized abdominal pain: Secondary | ICD-10-CM | POA: Diagnosis present

## 2022-10-02 ENCOUNTER — Encounter (INDEPENDENT_AMBULATORY_CARE_PROVIDER_SITE_OTHER): Payer: Self-pay | Admitting: *Deleted

## 2023-01-22 ENCOUNTER — Encounter (INDEPENDENT_AMBULATORY_CARE_PROVIDER_SITE_OTHER): Payer: Self-pay | Admitting: *Deleted

## 2023-06-26 ENCOUNTER — Encounter (INDEPENDENT_AMBULATORY_CARE_PROVIDER_SITE_OTHER): Payer: Self-pay | Admitting: *Deleted

## 2023-10-01 ENCOUNTER — Encounter (HOSPITAL_COMMUNITY): Payer: Self-pay

## 2023-10-01 ENCOUNTER — Other Ambulatory Visit: Payer: Self-pay

## 2023-10-01 ENCOUNTER — Emergency Department (HOSPITAL_COMMUNITY)
Admission: EM | Admit: 2023-10-01 | Discharge: 2023-10-01 | Disposition: A | Discharge status: Home / Self Care | Payer: BC Managed Care – PPO | Attending: Emergency Medicine | Admitting: Emergency Medicine

## 2023-10-01 DIAGNOSIS — R5383 Other fatigue: Secondary | ICD-10-CM | POA: Diagnosis not present

## 2023-10-01 DIAGNOSIS — E878 Other disorders of electrolyte and fluid balance, not elsewhere classified: Secondary | ICD-10-CM | POA: Insufficient documentation

## 2023-10-01 DIAGNOSIS — Z20822 Contact with and (suspected) exposure to covid-19: Secondary | ICD-10-CM | POA: Diagnosis not present

## 2023-10-01 DIAGNOSIS — D72829 Elevated white blood cell count, unspecified: Secondary | ICD-10-CM | POA: Insufficient documentation

## 2023-10-01 DIAGNOSIS — R197 Diarrhea, unspecified: Secondary | ICD-10-CM | POA: Insufficient documentation

## 2023-10-01 DIAGNOSIS — R059 Cough, unspecified: Secondary | ICD-10-CM | POA: Insufficient documentation

## 2023-10-01 DIAGNOSIS — E876 Hypokalemia: Secondary | ICD-10-CM | POA: Insufficient documentation

## 2023-10-01 DIAGNOSIS — R112 Nausea with vomiting, unspecified: Secondary | ICD-10-CM | POA: Diagnosis present

## 2023-10-01 DIAGNOSIS — R1084 Generalized abdominal pain: Secondary | ICD-10-CM | POA: Insufficient documentation

## 2023-10-01 DIAGNOSIS — R0981 Nasal congestion: Secondary | ICD-10-CM | POA: Diagnosis not present

## 2023-10-01 LAB — RESP PANEL BY RT-PCR (RSV, FLU A&B, COVID)  RVPGX2
Influenza A by PCR: NEGATIVE
Influenza B by PCR: NEGATIVE
Resp Syncytial Virus by PCR: NEGATIVE
SARS Coronavirus 2 by RT PCR: NEGATIVE

## 2023-10-01 LAB — URINALYSIS, ROUTINE W REFLEX MICROSCOPIC
Bilirubin Urine: NEGATIVE
Glucose, UA: NEGATIVE mg/dL
Hgb urine dipstick: NEGATIVE
Ketones, ur: NEGATIVE mg/dL
Leukocytes,Ua: NEGATIVE
Nitrite: NEGATIVE
Protein, ur: 30 mg/dL — AB
Specific Gravity, Urine: 1.025 (ref 1.005–1.030)
pH: 5 (ref 5.0–8.0)

## 2023-10-01 LAB — COMPREHENSIVE METABOLIC PANEL WITH GFR
ALT: 21 U/L (ref 0–44)
AST: 29 U/L (ref 15–41)
Albumin: 4.3 g/dL (ref 3.5–5.0)
Alkaline Phosphatase: 51 U/L (ref 38–126)
Anion gap: 14 (ref 5–15)
BUN: 15 mg/dL (ref 8–23)
CO2: 29 mmol/L (ref 22–32)
Calcium: 9.7 mg/dL (ref 8.9–10.3)
Chloride: 97 mmol/L — ABNORMAL LOW (ref 98–111)
Creatinine, Ser: 0.87 mg/dL (ref 0.44–1.00)
GFR, Estimated: >60 mL/min
Glucose, Bld: 145 mg/dL — ABNORMAL HIGH (ref 70–99)
Potassium: 2.8 mmol/L — ABNORMAL LOW (ref 3.5–5.1)
Sodium: 140 mmol/L (ref 135–145)
Total Bilirubin: 1 mg/dL (ref 0.0–1.2)
Total Protein: 8.3 g/dL — ABNORMAL HIGH (ref 6.5–8.1)

## 2023-10-01 LAB — CBC WITH DIFFERENTIAL/PLATELET
Abs Immature Granulocytes: 0.04 K/uL (ref 0.00–0.07)
Basophils Absolute: 0 K/uL (ref 0.0–0.1)
Basophils Relative: 0 %
Eosinophils Absolute: 0 K/uL (ref 0.0–0.5)
Eosinophils Relative: 0 %
HCT: 48 % — ABNORMAL HIGH (ref 36.0–46.0)
Hemoglobin: 15.8 g/dL — ABNORMAL HIGH (ref 12.0–15.0)
Immature Granulocytes: 0 %
Lymphocytes Relative: 7 %
Lymphs Abs: 0.8 K/uL (ref 0.7–4.0)
MCH: 30.2 pg (ref 26.0–34.0)
MCHC: 32.9 g/dL (ref 30.0–36.0)
MCV: 91.6 fL (ref 80.0–100.0)
Monocytes Absolute: 0.4 K/uL (ref 0.1–1.0)
Monocytes Relative: 4 %
Neutro Abs: 10.1 K/uL — ABNORMAL HIGH (ref 1.7–7.7)
Neutrophils Relative %: 89 %
Platelets: 212 K/uL (ref 150–400)
RBC: 5.24 MIL/uL — ABNORMAL HIGH (ref 3.87–5.11)
RDW: 12.1 % (ref 11.5–15.5)
WBC: 11.4 K/uL — ABNORMAL HIGH (ref 4.0–10.5)
nRBC: 0 % (ref 0.0–0.2)

## 2023-10-01 MED ORDER — ONDANSETRON HCL 4 MG PO TABS: 4.0000 mg | ORAL_TABLET | Freq: Three times a day (TID) | ORAL | Status: DC | PRN

## 2023-10-01 MED ORDER — DICYCLOMINE HCL 20 MG PO TABS: 20.0000 mg | ORAL_TABLET | Freq: Two times a day (BID) | ORAL | 0 refills | Status: AC

## 2023-10-01 MED ORDER — ONDANSETRON HCL 4 MG PO TABS: 4.0000 mg | ORAL_TABLET | Freq: Three times a day (TID) | ORAL | 0 refills | Status: AC | PRN

## 2023-10-01 MED ORDER — ONDANSETRON 8 MG PO TBDP
8.0000 mg | ORAL_TABLET | Freq: Once | ORAL | Status: AC
  Administered 2023-10-01: 8 mg via ORAL
  Filled 2023-10-01: qty 1

## 2023-10-01 MED ORDER — DICYCLOMINE HCL 10 MG PO CAPS
10.0000 mg | ORAL_CAPSULE | Freq: Once | ORAL | Status: AC
  Administered 2023-10-01: 10 mg via ORAL
  Filled 2023-10-01: qty 1

## 2023-10-01 MED ORDER — ONDANSETRON 4 MG PO TBDP
4.0000 mg | ORAL_TABLET | Freq: Once | ORAL | Status: AC
Start: 2023-10-01 — End: 2023-10-01
  Administered 2023-10-01: 4 mg via ORAL
  Filled 2023-10-01: qty 1

## 2023-10-01 MED ORDER — POTASSIUM CHLORIDE ER 10 MEQ PO TBCR: 10.0000 meq | EXTENDED_RELEASE_TABLET | Freq: Every day | ORAL | 0 refills | Status: AC

## 2023-10-01 MED ORDER — POTASSIUM CHLORIDE 20 MEQ PO PACK
40.0000 meq | PACK | Freq: Once | ORAL | Status: AC
  Administered 2023-10-01: 40 meq via ORAL
  Filled 2023-10-01: qty 2

## 2023-10-01 NOTE — ED Provider Notes (Signed)
 [place] EMERGENCY DEPARTMENT AT [facility] Provider Note   CSN: [id number] Arrival date & time: 10/01/23  1559     History  Chief Complaint  Patient presents with   Emesis    Alexandra Clark is a 63 y.o. female past medical history significant for NSTEMI presents today for emesis that began today.  Patient states that last Friday she began to have cough and congestion, but her symptoms have since worsened.  Patient also endorses fatigue, mild abdominal pain, nausea and diarrhea.  Patient denies blood in stool, hematuria, chest pain, shortness of breath, weakness, or hematemesis.   Emesis Associated symptoms: abdominal pain, cough and diarrhea        Home Medications Prior to Admission medications   Medication Sig Start Date End Date Taking? Authorizing Provider  dicyclomine  (BENTYL ) 20 MG tablet Take 1 tablet (20 mg total) by mouth 2 (two) times daily. 10/01/23  Yes Reylene Stauder N, PA-C  ondansetron  (ZOFRAN ) 4 MG tablet Take 1 tablet (4 mg total) by mouth every 8 (eight) hours as needed for nausea or vomiting. 10/01/23  Yes Aleksi Brummet N, PA-C  potassium chloride  (KLOR-CON ) 10 MEQ tablet Take 1 tablet (10 mEq total) by mouth daily. 10/01/23  Yes Brandyn Lowrey N, PA-C  atenolol -chlorthalidone  (TENORETIC ) 50-25 MG per tablet Take 1 tablet by mouth daily. 03/03/15   [provider]  cephALEXin  (KEFLEX ) 500 MG capsule 1  po  bid x 3 days Patient not taking: Reported on 03/28/2018 01/29/18   Perri Bjork, PA-C  Coenzyme Q10 (COQ10) 50 MG CAPS Take 1 capsule by mouth daily.    [provider]  furosemide (LASIX) 40 MG tablet Take 40 mg by mouth daily as needed for fluid. Takes in course of three days as needed for fluid retention    [provider]  HYDROcodone -acetaminophen  (NORCO/VICODIN) 5-325 MG tablet Take 1 tablet by mouth every 6 (six) hours as needed for moderate pain. Patient not taking: Reported on 03/28/2018 01/29/18   Henry Slough, MD  ibuprofen   (ADVIL ,MOTRIN ) 600 MG tablet 1 po pc every 6 hours for 5 days then as needed for pain Patient not taking: Reported on 03/28/2018 01/29/18   Perri Bjork, PA-C  PARoxetine  (PAXIL ) 10 MG tablet Take 10 mg by mouth at bedtime.  11/10/17   [provider]  rosuvastatin  (CRESTOR ) 5 MG tablet Take 5 mg by mouth at bedtime.  12/15/17   [provider]      Allergies    Patient has no known allergies.    Review of Systems   Review of Systems  Constitutional:  Positive for fatigue.  HENT:  Positive for congestion.   Respiratory:  Positive for cough.   Gastrointestinal:  Positive for abdominal pain, diarrhea, nausea and vomiting.    Physical Exam Updated Vital Signs BP (!) 140/57   Pulse 92   Temp 99.6 F (37.6 C)   Resp 18   Ht 5' 5.5 (1.664 m)   Wt 100.2 kg   SpO2 94%   BMI 36.20 kg/m  Physical Exam Vitals and nursing note reviewed.  Constitutional:      General: She is not in acute distress.    Appearance: Normal appearance. She is well-developed. She is ill-appearing. She is not toxic-appearing.  HENT:     Head: Normocephalic and atraumatic.     Right Ear: External ear normal.     Left Ear: External ear normal.     Nose: Congestion present.  Mouth/Throat:     Mouth: Mucous membranes are moist.  Eyes:     Conjunctiva/sclera: Conjunctivae normal.  Cardiovascular:     Rate and Rhythm: Normal rate and regular rhythm.     Pulses: Normal pulses.     Heart sounds: Normal heart sounds. No murmur heard. Pulmonary:     Effort: Pulmonary effort is normal. No respiratory distress.     Breath sounds: Normal breath sounds.  Abdominal:     General: Abdomen is protuberant. Bowel sounds are normal.     Palpations: Abdomen is soft.     Tenderness: There is generalized abdominal tenderness. There is no guarding or rebound. Negative signs include Murphy's sign, Rovsing's sign, McBurney's sign and psoas sign.  Musculoskeletal:        General: No swelling.     Cervical  back: Normal range of motion and neck supple.  Skin:    General: Skin is warm and dry.     Capillary Refill: Capillary refill takes less than 2 seconds.  Neurological:     General: No focal deficit present.     Mental Status: She is alert.     Motor: No weakness.  Psychiatric:        Mood and Affect: Mood normal.     ED Results / Procedures / Treatments   Labs (all labs ordered are listed, but only abnormal results are displayed) Labs Reviewed  CBC WITH DIFFERENTIAL/PLATELET - Abnormal; Notable for the following components:      Result Value   WBC 11.4 (*)    RBC 5.24 (*)    Hemoglobin 15.8 (*)    HCT 48.0 (*)    Neutro Abs 10.1 (*)    All other components within normal limits  COMPREHENSIVE METABOLIC PANEL - Abnormal; Notable for the following components:   Potassium 2.8 (*)    Chloride 97 (*)    Glucose, Bld 145 (*)    Total Protein 8.3 (*)    All other components within normal limits  URINALYSIS, ROUTINE W REFLEX MICROSCOPIC - Abnormal; Notable for the following components:   Protein, ur 30 (*)    Bacteria, UA RARE (*)    All other components within normal limits  RESP PANEL BY RT-PCR (RSV, FLU A&B, COVID)  RVPGX2    EKG None  Radiology No results found.  Procedures Procedures    Medications Ordered in ED Medications  ondansetron  (ZOFRAN -ODT) disintegrating tablet 4 mg (4 mg Oral Given 10/01/23 1616)  ondansetron  (ZOFRAN -ODT) disintegrating tablet 8 mg (8 mg Oral Given 10/01/23 2103)  potassium chloride  (KLOR-CON ) packet 40 mEq (40 mEq Oral Given 10/01/23 2102)  dicyclomine  (BENTYL ) capsule 10 mg (10 mg Oral Given 10/01/23 2103)    ED Course/ Medical Decision Making/ A&P                                 Medical Decision Making Amount and/or Complexity of Data Reviewed Labs: ordered.  Risk Prescription drug management.   This patient presents to the ED with chief complaint(s) of nausea, vomiting, diarrhea with pertinent past medical history of NSTEMI which  further complicates the presenting complaint. The complaint involves an extensive differential diagnosis and also carries with it a high risk of complications and morbidity.    The differential diagnosis includes COVID, flu, RSV, URI, GI illness, diverticulitis, appendicitis  Additional history obtained: Records reviewed Care Everywhere/External Records  ED Course and Reassessment: Patient given Zofran , Bentyl  and  oral potassium Patient able to tolerate p.o. intake prior to discharge.  Independent labs interpretation:  The following labs were independently interpreted:  CBC: Mild leukocytosis at 11.4, mildly elevated hemoglobin at 15.8 CMP: Mild hypokalemia at 2.8, mild hypochloremia 97, mildly elevated total protein 8.3 Respiratory panel: Negative UA: 30 protein, rare bacteria  Consultation: - Consulted or discussed management/test interpretation w/ external professional: None  Consideration for admission or further workup: Consider for mission further cover patient's vital signs, physical exam, and labs are reassuring.  Patient able to tolerate p.o. intake prior to discharge.  Patient given outpatient course of Bentyl , Zofran , and oral potassium.  Patient also encouraged to take Tylenol  and Motrin  as needed for pain and fever.  Patient should follow-up with primary care physician for further evaluation and treatment if symptoms persist.        Final Clinical Impression(s) / ED Diagnoses Final diagnoses:  Nausea vomiting and diarrhea    Rx / DC Orders ED Discharge Orders          Ordered    dicyclomine  (BENTYL ) 20 MG tablet  2 times daily        10/01/23 2251    ondansetron  (ZOFRAN ) 4 MG tablet  Every 8 hours PRN        10/01/23 2251    potassium chloride  (KLOR-CON ) 10 MEQ tablet  Daily        10/01/23 2251              Francis Ileana SAILOR, PA-C 10/01/23 2305    Suzette Pac, MD 10/06/23 [phone]

## 2023-10-01 NOTE — ED Notes (Signed)
 Reviewed D/C information with the patient, pt verbalized understanding. No additional concerns at this time.

## 2023-10-01 NOTE — ED Notes (Signed)
 Pt provided apple sauce and gatorade

## 2023-10-01 NOTE — ED Notes (Signed)
 No emesis or diarrhea since check in.

## 2023-10-01 NOTE — ED Triage Notes (Signed)
 Pt arrived via POV c/o emesis that began today. Pt reports last Friday, she began to have a cough, with congestion, but now symptoms have worsened and the Pt also reports diarrhea.

## 2023-10-01 NOTE — Discharge Instructions (Signed)
 Today you are seen for nausea, vomiting, and diarrhea.  Please pick up your medication and take as prescribed.  Please follow-up with your primary care physician for further evaluation and treatment in the upcoming week.  Thank you for letting us  treat you today. After performing a physical exam and reviewing your labs, I feel you are safe to go home. Please follow up with your PCP in the next several days and provide them with your records from this visit. Return to the Emergency Room if pain becomes severe or symptoms worsen.

## 2024-01-23 ENCOUNTER — Encounter (HOSPITAL_COMMUNITY): Payer: Self-pay

## 2024-01-23 ENCOUNTER — Emergency Department (HOSPITAL_COMMUNITY)
Admission: EM | Admit: 2024-01-23 | Discharge: 2024-01-23 | Disposition: A | Discharge status: Home / Self Care | Attending: Emergency Medicine | Admitting: Emergency Medicine

## 2024-01-23 ENCOUNTER — Emergency Department (HOSPITAL_COMMUNITY)

## 2024-01-23 ENCOUNTER — Other Ambulatory Visit: Payer: Self-pay

## 2024-01-23 DIAGNOSIS — E876 Hypokalemia: Secondary | ICD-10-CM | POA: Insufficient documentation

## 2024-01-23 DIAGNOSIS — R0600 Dyspnea, unspecified: Secondary | ICD-10-CM | POA: Insufficient documentation

## 2024-01-23 DIAGNOSIS — Z79899 Other long term (current) drug therapy: Secondary | ICD-10-CM | POA: Diagnosis not present

## 2024-01-23 DIAGNOSIS — R42 Dizziness and giddiness: Secondary | ICD-10-CM | POA: Diagnosis present

## 2024-01-23 DIAGNOSIS — D72829 Elevated white blood cell count, unspecified: Secondary | ICD-10-CM | POA: Insufficient documentation

## 2024-01-23 DIAGNOSIS — I10 Essential (primary) hypertension: Secondary | ICD-10-CM | POA: Diagnosis not present

## 2024-01-23 LAB — COMPREHENSIVE METABOLIC PANEL WITH GFR
ALT: 22 U/L (ref 0–44)
AST: 33 U/L (ref 15–41)
Albumin: 3.9 g/dL (ref 3.5–5.0)
Alkaline Phosphatase: 47 U/L (ref 38–126)
Anion gap: 13 (ref 5–15)
BUN: 20 mg/dL (ref 8–23)
CO2: 25 mmol/L (ref 22–32)
Calcium: 9.3 mg/dL (ref 8.9–10.3)
Chloride: 99 mmol/L (ref 98–111)
Creatinine, Ser: 0.83 mg/dL (ref 0.44–1.00)
GFR, Estimated: >60 mL/min (ref >60)
Glucose, Bld: 167 mg/dL — ABNORMAL HIGH (ref 70–99)
Potassium: 2.9 mmol/L — ABNORMAL LOW (ref 3.5–5.1)
Sodium: 137 mmol/L (ref 135–145)
Total Bilirubin: 0.7 mg/dL (ref 0.0–1.2)
Total Protein: 7.8 g/dL (ref 6.5–8.1)

## 2024-01-23 LAB — BRAIN NATRIURETIC PEPTIDE: B Natriuretic Peptide: 20 pg/mL (ref 0.0–100.0)

## 2024-01-23 LAB — CBC WITH DIFFERENTIAL/PLATELET
Abs Immature Granulocytes: 0 10*3/uL (ref 0.00–0.07)
Basophils Absolute: 0 10*3/uL (ref 0.0–0.1)
Basophils Relative: 0 %
Eosinophils Absolute: 0.1 10*3/uL (ref 0.0–0.5)
Eosinophils Relative: 1 %
HCT: 42.2 % (ref 36.0–46.0)
Hemoglobin: 14.2 g/dL (ref 12.0–15.0)
Lymphocytes Relative: 57 %
Lymphs Abs: 6.4 10*3/uL — ABNORMAL HIGH (ref 0.7–4.0)
MCH: 30.6 pg (ref 26.0–34.0)
MCHC: 33.6 g/dL (ref 30.0–36.0)
MCV: 90.9 fL (ref 80.0–100.0)
Monocytes Absolute: 0.5 10*3/uL (ref 0.1–1.0)
Monocytes Relative: 4 %
Neutro Abs: 4.3 10*3/uL (ref 1.7–7.7)
Neutrophils Relative %: 38 %
Platelets: 238 10*3/uL (ref 150–400)
RBC: 4.64 MIL/uL (ref 3.87–5.11)
RDW: 12 % (ref 11.5–15.5)
WBC: 11.3 10*3/uL — ABNORMAL HIGH (ref 4.0–10.5)
nRBC: 0 % (ref 0.0–0.2)

## 2024-01-23 LAB — CBG MONITORING, ED: Glucose-Capillary: 162 mg/dL — ABNORMAL HIGH (ref 70–99)

## 2024-01-23 LAB — TROPONIN I (HIGH SENSITIVITY): Troponin I (High Sensitivity): 2 ng/L (ref <18)

## 2024-01-23 MED ORDER — SODIUM CHLORIDE 0.9 % IV BOLUS
1000.0000 mL | Freq: Once | INTRAVENOUS | Status: AC
  Administered 2024-01-23: 1000 mL via INTRAVENOUS

## 2024-01-23 NOTE — Discharge Instructions (Signed)
 Double your potassium dose for the next 5 days.  Follow-up with primary doctor if symptoms are not improving, and return to the ER if symptoms significantly worsen or change.

## 2024-01-23 NOTE — ED Triage Notes (Signed)
 Pov from home. Cc of dizziness, sob, lightheadedness. That started this morning after she got up.  Denies pain. Tearful during triage,says she thinks she might be having a panic attack too

## 2024-01-23 NOTE — ED Notes (Signed)
 Patient transported to CT

## 2024-01-23 NOTE — ED Provider Notes (Signed)
  EMERGENCY DEPARTMENT AT [facility] Provider Note   CSN: [id number] Arrival date & time: 01/23/24  4098     History  Chief Complaint  Patient presents with   Shortness of Breath    Alexandra Clark is a 63 y.o. female.  Patient is a 63 year old female with past medical history of hypertension, prediabetes, hyperlipidemia.  Patient presenting today for evaluation of shortness of breath and dizziness.  She went to bed last night feeling well, then woke up this morning to get ready for work.  As she sat up on the bed, she began to feel short of breath and dizzy.  She reports falling back into bed and experiencing worsening symptoms.  She took her blood sugar which was normal, then took her blood pressure which was elevated.  She then had her husband bring her here for evaluation.  She denies fevers or chills.  No cough.  She denies to me she is having any chest pain.       Home Medications Prior to Admission medications   Medication Sig Start Date End Date Taking? Authorizing Provider  atenolol -chlorthalidone  (TENORETIC ) 50-25 MG per tablet Take 1 tablet by mouth daily. 03/03/15   [provider]  cephALEXin  (KEFLEX ) 500 MG capsule 1  po  bid x 3 days Patient not taking: Reported on 03/28/2018 01/29/18   Ivin Marrow, PA-C  Coenzyme Q10 (COQ10) 50 MG CAPS Take 1 capsule by mouth daily.    [provider]  dicyclomine  (BENTYL ) 20 MG tablet Take 1 tablet (20 mg total) by mouth 2 (two) times daily. 10/01/23   Keith, Kayla N, PA-C  furosemide (LASIX) 40 MG tablet Take 40 mg by mouth daily as needed for fluid. Takes in course of three days as needed for fluid retention    [provider]  HYDROcodone -acetaminophen  (NORCO/VICODIN) 5-325 MG tablet Take 1 tablet by mouth every 6 (six) hours as needed for moderate pain. Patient not taking: Reported on 03/28/2018 01/29/18   Renea Carrion, MD  ibuprofen  (ADVIL ,MOTRIN ) 600 MG tablet 1 po pc every 6 hours for  5 days then as needed for pain Patient not taking: Reported on 03/28/2018 01/29/18   Ivin Marrow, PA-C  ondansetron  (ZOFRAN ) 4 MG tablet Take 1 tablet (4 mg total) by mouth every 8 (eight) hours as needed for nausea or vomiting. 10/01/23   Keith, Kayla N, PA-C  PARoxetine  (PAXIL ) 10 MG tablet Take 10 mg by mouth at bedtime.  11/10/17   [provider]  potassium chloride  (KLOR-CON ) 10 MEQ tablet Take 1 tablet (10 mEq total) by mouth daily. 10/01/23   Keith, Kayla N, PA-C  rosuvastatin  (CRESTOR ) 5 MG tablet Take 5 mg by mouth at bedtime.  12/15/17   [provider]      Allergies    Patient has no known allergies.    Review of Systems   Review of Systems  All other systems reviewed and are negative.   Physical Exam Updated Vital Signs BP (!) 141/65   Pulse 83   Temp 97.7 F (36.5 C) (Oral)   Resp 13   Ht 5' 5.5" (1.664 m)   Wt 101 kg   SpO2 97%   BMI 36.49 kg/m  Physical Exam Vitals and nursing note reviewed.  Constitutional:      General: She is not in acute distress.    Appearance: She is well-developed. She is not diaphoretic.  HENT:     Head: Normocephalic and atraumatic.  Eyes:  Extraocular Movements: Extraocular movements intact.     Pupils: Pupils are equal, round, and reactive to light.  Cardiovascular:     Rate and Rhythm: Normal rate and regular rhythm.     Heart sounds: No murmur heard.    No friction rub. No gallop.  Pulmonary:     Effort: Pulmonary effort is normal. No respiratory distress.     Breath sounds: Normal breath sounds. No wheezing.  Abdominal:     General: Bowel sounds are normal. There is no distension.     Palpations: Abdomen is soft.     Tenderness: There is no abdominal tenderness.  Musculoskeletal:        General: Normal range of motion.     Cervical back: Normal range of motion and neck supple.  Skin:    General: Skin is warm and dry.  Neurological:     General: No focal deficit present.     Mental Status: She is  alert and oriented to person, place, and time.     Cranial Nerves: No cranial nerve deficit.     Motor: No weakness.     ED Results / Procedures / Treatments   Labs (all labs ordered are listed, but only abnormal results are displayed) Labs Reviewed  CBG MONITORING, ED - Abnormal; Notable for the following components:      Result Value   Glucose-Capillary 162 (*)    All other components within normal limits    EKG EKG Interpretation Date/Time:  Friday Jan 23 2024 05:40:19 EDT Ventricular Rate:  92 PR Interval:  188 QRS Duration:  97 QT Interval:  435 QTC Calculation: 539 R Axis:   30  Text Interpretation: Sinus rhythm Low voltage, precordial leads Borderline abnrm T, anterolateral leads Prolonged QT interval No significant change since 03/28/2018 Confirmed by Orvilla Blander ([id number]) on 01/23/2024 5:47:45 AM  Radiology No results found.  Procedures Procedures    Medications Ordered in ED Medications - No data to display  ED Course/ Medical Decision Making/ A&P  Patient is a 63 year old female presenting after an episode of dizziness, shortness of breath, and feeling generally unwell.  This started upon waking this morning to go to work.  Patient arrives here with stable vital signs and is afebrile.  Physical examination is basically unremarkable.  Patient is somewhat tearful and anxious appearing, but no other abnormal findings.  Laboratory studies obtained including CBC, metabolic panel, troponin, and BNP.  She has a potassium of 2.9, but laboratory studies basically unremarkable otherwise.  CT scan of the head obtained due to the reported dizziness showing no acute abnormality.  Chest x-ray showing no acute process.  Patient hydrated with normal saline and observed in the ER for nearly 2 hours.  Her vital signs have remained stable with no hypoxia.  Her blood pressure was initially slightly elevated, but is now 117 systolic.  I feel as though the patient can safely be  discharged.  Cause of her symptoms unclear, but may have some anxiety component.  There is no evidence for a cardiac etiology in the workup and EKG is unchanged.  Chest x-ray is clear and also highly doubt pulmonary embolism.  Patient to be discharged with as needed return.  Final Clinical Impression(s) / ED Diagnoses Final diagnoses:  None    Rx / DC Orders ED Discharge Orders     None         Orvilla Blander, MD 01/23/24 [phone]
